# Patient Record
Sex: Female | Born: 1985 | Race: Black or African American | Hispanic: No | Marital: Married | Smoking: Former smoker
Health system: Southern US, Community
[De-identification: ages and names within clinical notes are randomized; demographics above are authoritative.]

## PROBLEM LIST (undated history)

## (undated) ENCOUNTER — Ambulatory Visit: Admission: EM | Payer: Medicaid Other | Source: Home / Self Care

## (undated) ENCOUNTER — Inpatient Hospital Stay: Payer: Self-pay

## (undated) DIAGNOSIS — E669 Obesity, unspecified: Secondary | ICD-10-CM

---
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
## 2011-10-30 ENCOUNTER — Emergency Department: Payer: Self-pay | Admitting: Emergency Medicine

## 2012-01-03 ENCOUNTER — Emergency Department: Payer: Self-pay | Admitting: Emergency Medicine

## 2012-05-19 ENCOUNTER — Emergency Department: Payer: Self-pay | Admitting: Emergency Medicine

## 2012-05-19 LAB — URINALYSIS, COMPLETE
Bilirubin,UR: NEGATIVE
Leukocyte Esterase: NEGATIVE
Nitrite: NEGATIVE
Ph: 6 (ref 4.5–8.0)
Protein: NEGATIVE
RBC,UR: <1 /HPF (ref 0–5)
Specific Gravity: 1.024 (ref 1.003–1.030)

## 2012-12-16 ENCOUNTER — Emergency Department: Payer: Self-pay | Admitting: Emergency Medicine

## 2012-12-24 ENCOUNTER — Emergency Department: Payer: Self-pay | Admitting: Emergency Medicine

## 2012-12-24 LAB — URINALYSIS, COMPLETE
Bacteria: NONE SEEN
Bilirubin,UR: NEGATIVE
Glucose,UR: NEGATIVE mg/dL (ref 0–75)
Ketone: NEGATIVE
Nitrite: NEGATIVE
Ph: 7 (ref 4.5–8.0)
Protein: NEGATIVE
RBC,UR: 1 /HPF (ref 0–5)
Specific Gravity: 1.014 (ref 1.003–1.030)
Squamous Epithelial: 1
WBC UR: <1 /HPF (ref 0–5)

## 2012-12-24 LAB — COMPREHENSIVE METABOLIC PANEL
Anion Gap: 9 (ref 7–16)
BUN: 11 mg/dL (ref 7–18)
Calcium, Total: 8.8 mg/dL (ref 8.5–10.1)
Chloride: 107 mmol/L (ref 98–107)
EGFR (African American): >60
Glucose: 101 mg/dL — ABNORMAL HIGH (ref 65–99)
Sodium: 138 mmol/L (ref 136–145)
Total Protein: 8.3 g/dL — ABNORMAL HIGH (ref 6.4–8.2)

## 2012-12-24 LAB — DRUG SCREEN, URINE
Amphetamines, Ur Screen: NEGATIVE (ref ?–1000)
Benzodiazepine, Ur Scrn: NEGATIVE (ref ?–200)
Cocaine Metabolite,Ur ~~LOC~~: NEGATIVE (ref ?–300)
MDMA (Ecstasy)Ur Screen: NEGATIVE (ref ?–500)
Methadone, Ur Screen: NEGATIVE (ref ?–300)
Phencyclidine (PCP) Ur S: NEGATIVE (ref ?–25)
Tricyclic, Ur Screen: NEGATIVE (ref ?–1000)

## 2012-12-24 LAB — CBC
HCT: 39.6 % (ref 35.0–47.0)
HGB: 13.2 g/dL (ref 12.0–16.0)
MCH: 26.9 pg (ref 26.0–34.0)
MCHC: 33.4 g/dL (ref 32.0–36.0)
MCV: 81 fL (ref 80–100)
Platelet: 253 10*3/uL (ref 150–440)
RBC: 4.91 10*6/uL (ref 3.80–5.20)
WBC: 6.9 10*3/uL (ref 3.6–11.0)

## 2012-12-24 LAB — ETHANOL: Ethanol %: <0.003 % (ref 0.000–0.080)

## 2012-12-24 LAB — PREGNANCY, URINE: Pregnancy Test, Urine: NEGATIVE m[IU]/mL

## 2012-12-24 LAB — TSH: Thyroid Stimulating Horm: 1.08 u[IU]/mL

## 2012-12-24 LAB — TROPONIN I: Troponin-I: <0.02 ng/mL

## 2012-12-24 LAB — CK TOTAL AND CKMB (NOT AT ARMC): CK, Total: 204 U/L (ref 21–215)

## 2013-06-21 ENCOUNTER — Emergency Department: Payer: Self-pay | Admitting: Emergency Medicine

## 2013-07-09 ENCOUNTER — Emergency Department: Payer: Self-pay | Admitting: Emergency Medicine

## 2013-07-09 LAB — CBC WITH DIFFERENTIAL/PLATELET
BASOS ABS: 0.1 10*3/uL (ref 0.0–0.1)
Basophil %: 1.4 %
EOS ABS: 0 10*3/uL (ref 0.0–0.7)
EOS PCT: 0.5 %
HCT: 40.2 % (ref 35.0–47.0)
HGB: 12.2 g/dL (ref 12.0–16.0)
Lymphocyte #: 2.4 10*3/uL (ref 1.0–3.6)
Lymphocyte %: 35.9 %
MCH: 25 pg — ABNORMAL LOW (ref 26.0–34.0)
MCHC: 30.4 g/dL — ABNORMAL LOW (ref 32.0–36.0)
MCV: 82 fL (ref 80–100)
MONOS PCT: 5.7 %
Monocyte #: 0.4 x10 3/mm (ref 0.2–0.9)
NEUTROS ABS: 3.8 10*3/uL (ref 1.4–6.5)
Neutrophil %: 56.5 %
Platelet: 219 10*3/uL (ref 150–440)
RBC: 4.88 10*6/uL (ref 3.80–5.20)
RDW: 15.2 % — ABNORMAL HIGH (ref 11.5–14.5)
WBC: 6.7 10*3/uL (ref 3.6–11.0)

## 2013-07-09 LAB — COMPREHENSIVE METABOLIC PANEL
ALT: 15 U/L (ref 12–78)
Albumin: 3.6 g/dL (ref 3.4–5.0)
Alkaline Phosphatase: 59 U/L
Anion Gap: 3 — ABNORMAL LOW (ref 7–16)
BILIRUBIN TOTAL: 0.3 mg/dL (ref 0.2–1.0)
BUN: 13 mg/dL (ref 7–18)
CHLORIDE: 105 mmol/L (ref 98–107)
CREATININE: 0.7 mg/dL (ref 0.60–1.30)
Calcium, Total: 8.5 mg/dL (ref 8.5–10.1)
Co2: 29 mmol/L (ref 21–32)
EGFR (African American): >60
EGFR (Non-African Amer.): >60
Glucose: 124 mg/dL — ABNORMAL HIGH (ref 65–99)
OSMOLALITY: 275 (ref 275–301)
Potassium: 3.4 mmol/L — ABNORMAL LOW (ref 3.5–5.1)
SGOT(AST): 20 U/L (ref 15–37)
Sodium: 137 mmol/L (ref 136–145)
Total Protein: 7.5 g/dL (ref 6.4–8.2)

## 2013-07-09 LAB — URINALYSIS, COMPLETE
Bacteria: NONE SEEN
Bilirubin,UR: NEGATIVE
Blood: NEGATIVE
GLUCOSE, UR: NEGATIVE mg/dL (ref 0–75)
Nitrite: NEGATIVE
Ph: 5 (ref 4.5–8.0)
Protein: NEGATIVE
RBC,UR: NONE SEEN /HPF (ref 0–5)
Specific Gravity: 1.024 (ref 1.003–1.030)
Squamous Epithelial: 9

## 2013-07-09 LAB — WET PREP, GENITAL

## 2013-07-09 LAB — GC/CHLAMYDIA PROBE AMP

## 2013-07-28 ENCOUNTER — Emergency Department: Payer: Self-pay

## 2013-07-29 ENCOUNTER — Emergency Department: Payer: Self-pay | Admitting: Emergency Medicine

## 2013-07-29 LAB — CBC
HCT: 38.8 % (ref 35.0–47.0)
HGB: 12.6 g/dL (ref 12.0–16.0)
MCH: 26.7 pg (ref 26.0–34.0)
MCHC: 32.4 g/dL (ref 32.0–36.0)
MCV: 82 fL (ref 80–100)
PLATELETS: 264 10*3/uL (ref 150–440)
RBC: 4.72 10*6/uL (ref 3.80–5.20)
RDW: 15.2 % — AB (ref 11.5–14.5)
WBC: 6.9 10*3/uL (ref 3.6–11.0)

## 2013-07-29 LAB — URINALYSIS, COMPLETE
BILIRUBIN, UR: NEGATIVE
Glucose,UR: NEGATIVE mg/dL (ref 0–75)
KETONE: NEGATIVE
Nitrite: NEGATIVE
Ph: 6 (ref 4.5–8.0)
Protein: NEGATIVE
RBC,UR: 1416 /HPF (ref 0–5)
Specific Gravity: 1.023 (ref 1.003–1.030)
Squamous Epithelial: 3

## 2013-07-29 LAB — HCG, QUANTITATIVE, PREGNANCY: BETA HCG, QUANT.: 4 m[IU]/mL — AB

## 2013-08-23 ENCOUNTER — Emergency Department: Payer: Self-pay | Admitting: Emergency Medicine

## 2013-08-23 LAB — URINALYSIS, COMPLETE
BACTERIA: NONE SEEN
BILIRUBIN, UR: NEGATIVE
Blood: NEGATIVE
Glucose,UR: NEGATIVE mg/dL (ref 0–75)
Ketone: NEGATIVE
NITRITE: NEGATIVE
PROTEIN: NEGATIVE
Ph: 7 (ref 4.5–8.0)
SPECIFIC GRAVITY: 1.02 (ref 1.003–1.030)
Squamous Epithelial: 12

## 2013-08-23 LAB — PREGNANCY, URINE: Pregnancy Test, Urine: NEGATIVE m[IU]/mL

## 2013-08-25 LAB — URINE CULTURE

## 2013-09-07 ENCOUNTER — Emergency Department: Payer: Self-pay | Admitting: Emergency Medicine

## 2013-09-17 ENCOUNTER — Emergency Department: Payer: Self-pay | Admitting: Emergency Medicine

## 2013-09-17 LAB — URINALYSIS, COMPLETE
Bilirubin,UR: NEGATIVE
Blood: NEGATIVE
Glucose,UR: NEGATIVE mg/dL (ref 0–75)
Ketone: NEGATIVE
Nitrite: NEGATIVE
PH: 7 (ref 4.5–8.0)
Protein: NEGATIVE
Specific Gravity: 1.021 (ref 1.003–1.030)
Squamous Epithelial: 8
WBC UR: 11 /HPF (ref 0–5)

## 2013-09-17 LAB — GC/CHLAMYDIA PROBE AMP

## 2013-09-17 LAB — WET PREP, GENITAL

## 2013-11-25 IMAGING — CT CT HEAD WITHOUT CONTRAST
1 series · 16 of 30 positions shown, 20 images · non-contrast
Comparison: none

REASON FOR EXAM: SYNCOPE
COMMENTS:

PROCEDURE:     CT  - CT HEAD WITHOUT CONTRAST  - December 24, 2012  [DATE]
RESULT:     Comparison:  None
TECHNIQUE: Multiple axial images from the foramen magnum to the vertex were
obtained without IV contrast.

[Series 2: soft tissue · axial · 0.38mm/px · z∈[+316,+456]mm · 16 of 32 slices shown, 20 images]
[im 2/32  brain]
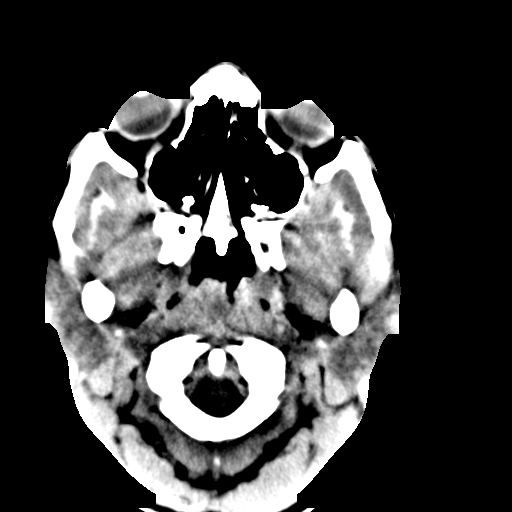
[im 2/32  bone]
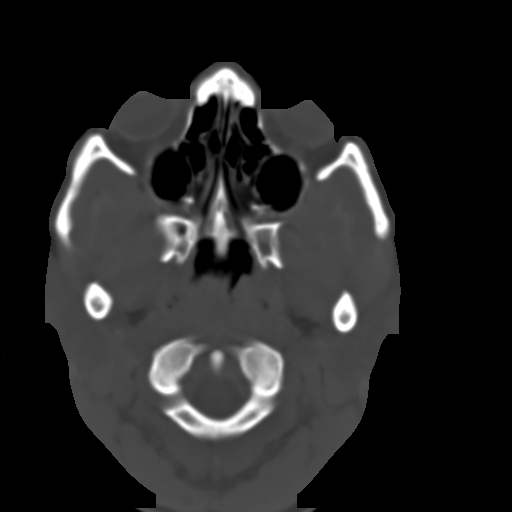
[im 4/32  brain]
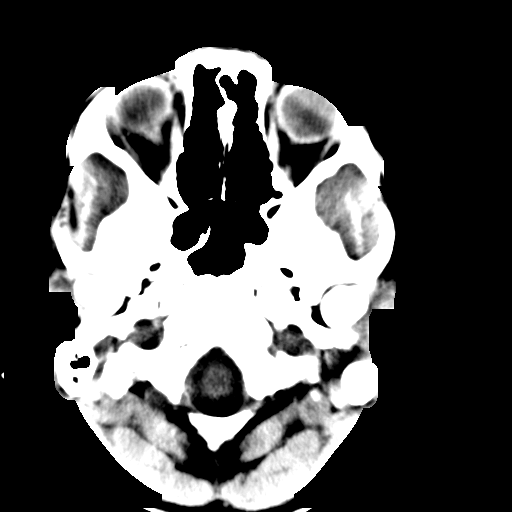
[im 6/32  brain]
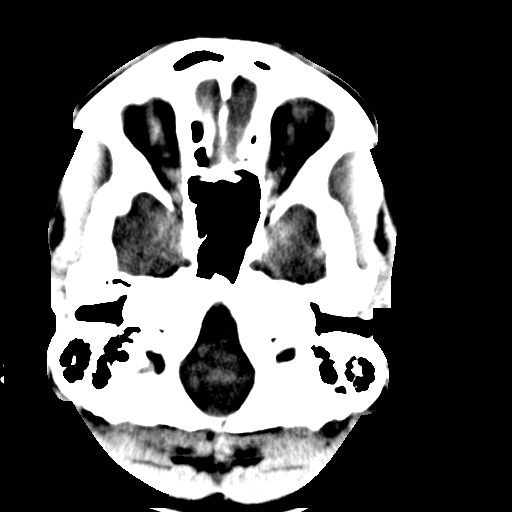
[im 8/32  brain]
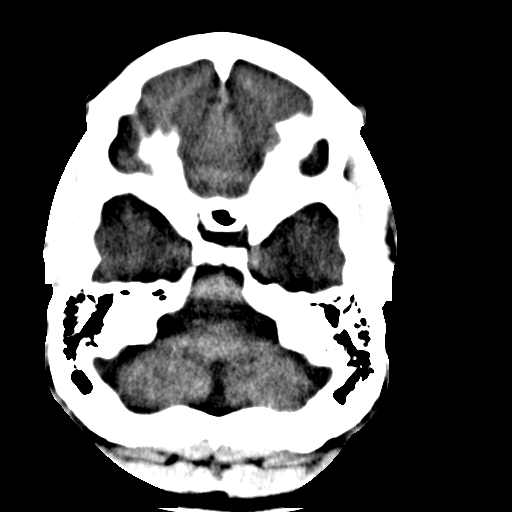
[im 9/32  brain]
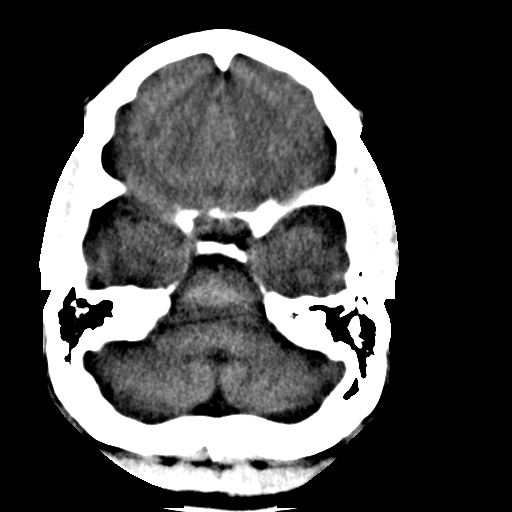
[im 9/32  bone]
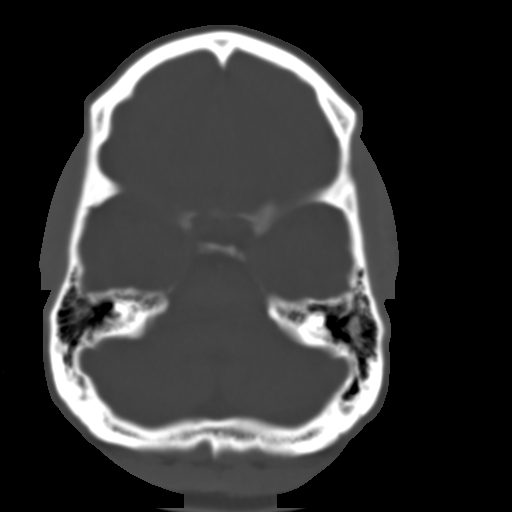
[im 11/32  brain]
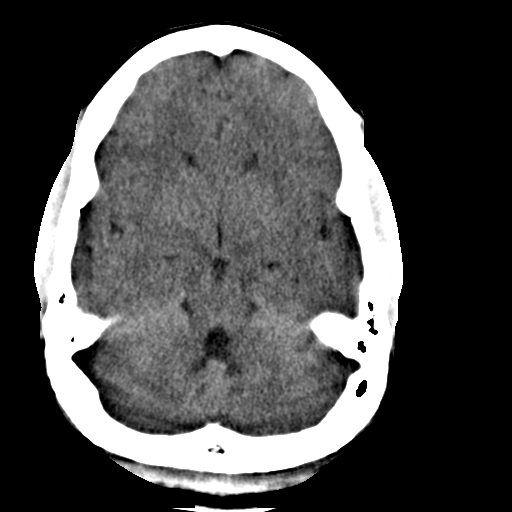
[im 13/32  brain]
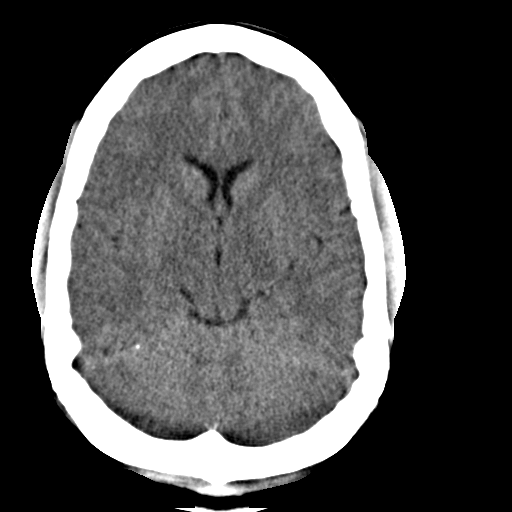
[im 15/32  brain]
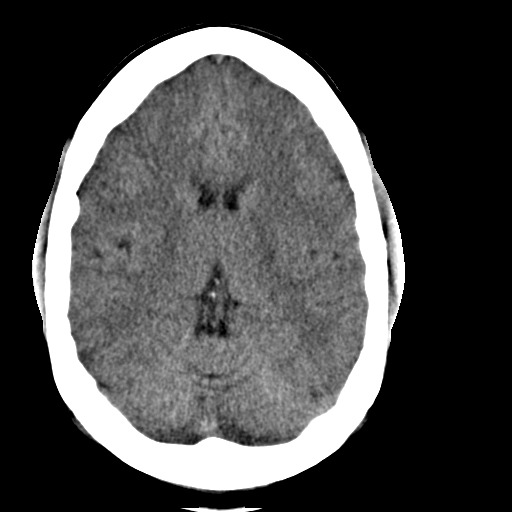
[im 17/32  brain]
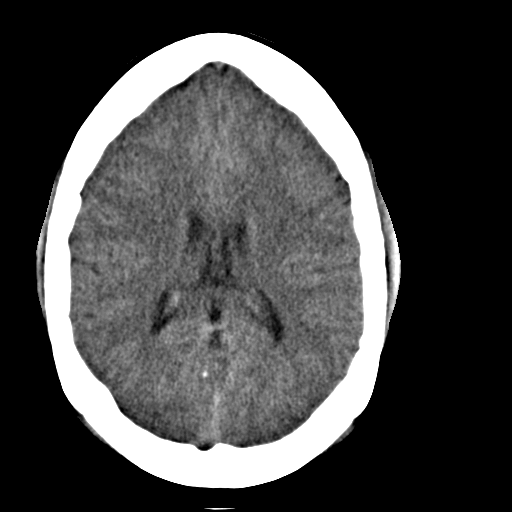
[im 17/32  bone]
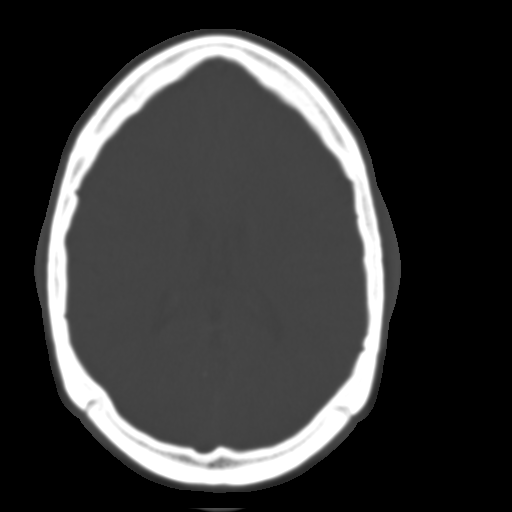
[im 19/32  brain]
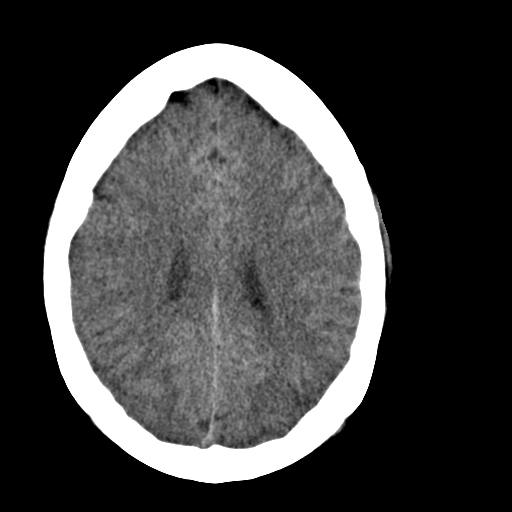
[im 21/32  brain]
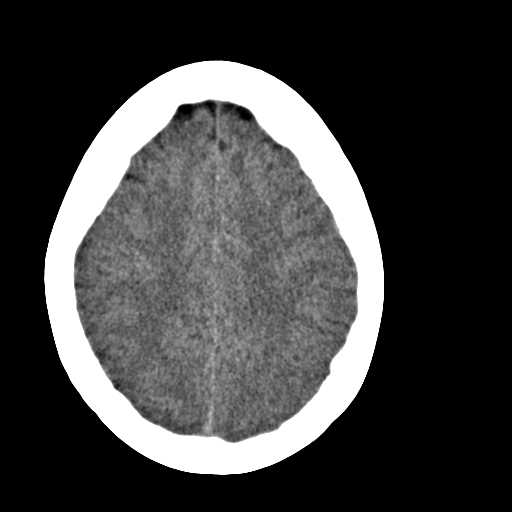
[im 23/32  brain]
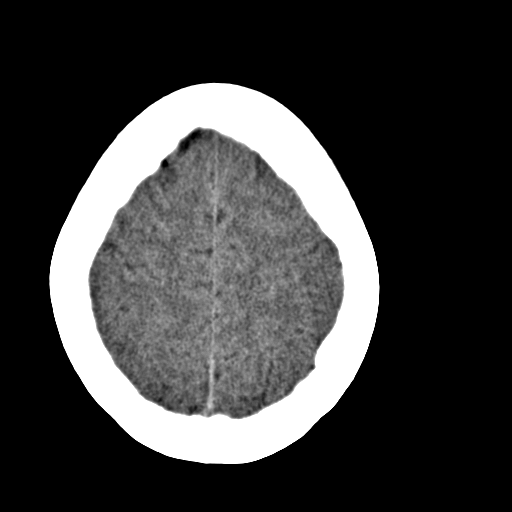
[im 24/32  brain]
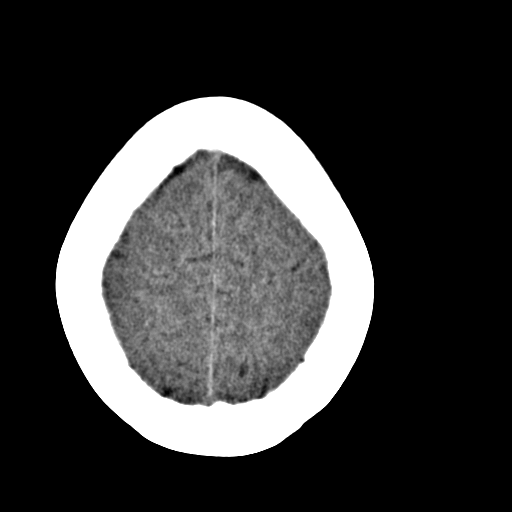
[im 24/32  bone]
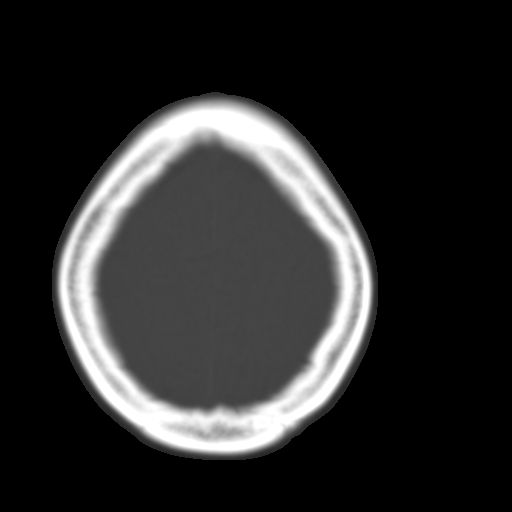
[im 26/32  brain]
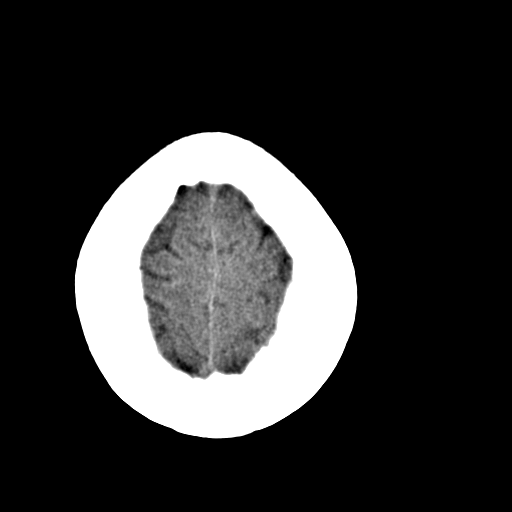
[im 28/32  brain]
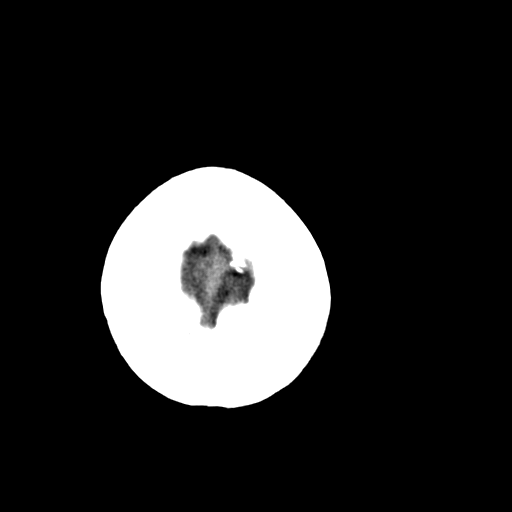
[im 30/32  brain]
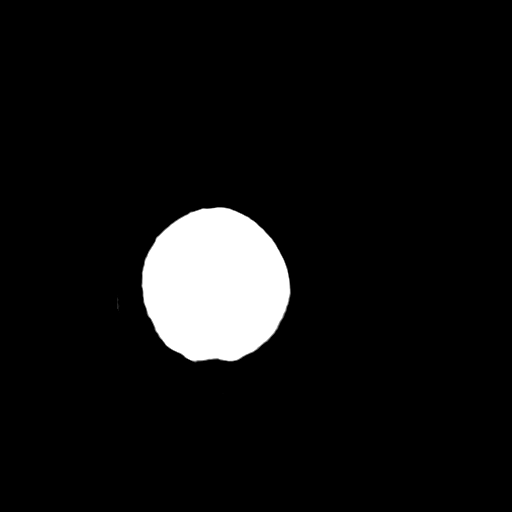

[16 of 30 positions shown; findings below may reference images not displayed]

FINDINGS: There is no evidence of mass effect, midline shift, or extra-axial fluid
collections.  There is no evidence of a space-occupying lesion or
intracranial hemorrhage. There is no evidence of a cortical-based area of
acute infarction.

The ventricles and sulci are appropriate for the patient's age. The basal
cisterns are patent.

Visualized portions of the orbits are unremarkable. The visualized portions
of the paranasal sinuses and mastoid air cells are unremarkable.

The osseous structures are unremarkable.
IMPRESSION: No acute intracranial process.

[REDACTED]

## 2014-03-12 ENCOUNTER — Emergency Department: Payer: Self-pay | Admitting: Emergency Medicine

## 2014-03-12 LAB — BASIC METABOLIC PANEL
Anion Gap: 3 — ABNORMAL LOW (ref 7–16)
BUN: 14 mg/dL (ref 7–18)
CALCIUM: 8.2 mg/dL — AB (ref 8.5–10.1)
CHLORIDE: 105 mmol/L (ref 98–107)
Co2: 30 mmol/L (ref 21–32)
Creatinine: 1 mg/dL (ref 0.60–1.30)
EGFR (African American): >60
Glucose: 92 mg/dL (ref 65–99)
OSMOLALITY: 276 (ref 275–301)
Potassium: 3.7 mmol/L (ref 3.5–5.1)
Sodium: 138 mmol/L (ref 136–145)

## 2014-03-12 LAB — URINALYSIS, COMPLETE
BACTERIA: NONE SEEN
Bilirubin,UR: NEGATIVE
Blood: NEGATIVE
Glucose,UR: NEGATIVE mg/dL (ref 0–75)
Ketone: NEGATIVE
Leukocyte Esterase: NEGATIVE
Nitrite: NEGATIVE
Ph: 6 (ref 4.5–8.0)
Protein: NEGATIVE
RBC,UR: 1 /HPF (ref 0–5)
Specific Gravity: 1.024 (ref 1.003–1.030)
Squamous Epithelial: 4
WBC UR: 1 /HPF (ref 0–5)

## 2014-03-12 LAB — CBC
HCT: 38.6 % (ref 35.0–47.0)
HGB: 12.2 g/dL (ref 12.0–16.0)
MCH: 26 pg (ref 26.0–34.0)
MCHC: 31.7 g/dL — AB (ref 32.0–36.0)
MCV: 82 fL (ref 80–100)
Platelet: 256 10*3/uL (ref 150–440)
RBC: 4.71 10*6/uL (ref 3.80–5.20)
RDW: 14.2 % (ref 11.5–14.5)
WBC: 9.1 10*3/uL (ref 3.6–11.0)

## 2014-03-12 LAB — HCG, QUANTITATIVE, PREGNANCY: Beta Hcg, Quant.: 4090 m[IU]/mL — ABNORMAL HIGH

## 2014-03-25 NOTE — L&D Delivery Note (Signed)
Delivery Note At 17:15 PM a viable female was delivered via Vaginal, Spontaneous Delivery (Presentation: Middle Occiput Anterior).  APGAR: 8, 9; weight 7 lb 13.2 oz (3549 g).   Placenta status: Intact, Spontaneous.  Cord: 3 vessels with the following complications: none observed.  Cord pH: n/a  Anesthesia: None  Episiotomy: None Lacerations: None Suture Repair: none Est. Blood Loss (mL):  300cc  Mom to postpartum.  Baby to Couplet care / Skin to Skin.  Patient was complete and began pushing for a short and controlled second stage.  She delivered as above, with no difficulty.  Baby was placed immediately on mom's chest and was vigorous.  Once pulsing ceased the cord was doubly clamped and cut by dad!  Fundal massage performed to delivery placenta.  No lacerations or abrasions.  We sang happy birthday as baby was recovered by nursing.  Mom was a champ!   ----- Ranae Plumber, MD Attending Obstetrician and Gynecologist [facility]

## 2014-04-01 ENCOUNTER — Emergency Department: Payer: Self-pay | Admitting: Emergency Medicine

## 2014-04-01 LAB — COMPREHENSIVE METABOLIC PANEL
ALBUMIN: 3.1 g/dL — AB (ref 3.4–5.0)
ALK PHOS: 76 U/L
ALT: 40 U/L
AST: 35 U/L (ref 15–37)
Anion Gap: 4 — ABNORMAL LOW (ref 7–16)
BUN: 11 mg/dL (ref 7–18)
Bilirubin,Total: 0.2 mg/dL (ref 0.2–1.0)
CO2: 27 mmol/L (ref 21–32)
Calcium, Total: 9.1 mg/dL (ref 8.5–10.1)
Chloride: 107 mmol/L (ref 98–107)
Creatinine: 0.83 mg/dL (ref 0.60–1.30)
EGFR (Non-African Amer.): >60
GLUCOSE: 98 mg/dL (ref 65–99)
Osmolality: 275 (ref 275–301)
Potassium: 3.9 mmol/L (ref 3.5–5.1)
Sodium: 138 mmol/L (ref 136–145)
Total Protein: 7.3 g/dL (ref 6.4–8.2)

## 2014-04-01 LAB — URINALYSIS, COMPLETE
Bacteria: NONE SEEN
Bilirubin,UR: NEGATIVE
Blood: NEGATIVE
Glucose,UR: NEGATIVE mg/dL (ref 0–75)
Ketone: NEGATIVE
Leukocyte Esterase: NEGATIVE
NITRITE: NEGATIVE
PH: 7 (ref 4.5–8.0)
PROTEIN: NEGATIVE
Specific Gravity: 1.015 (ref 1.003–1.030)
Squamous Epithelial: 1
WBC UR: <1 /HPF (ref 0–5)

## 2014-04-01 LAB — CBC WITH DIFFERENTIAL/PLATELET
Basophil #: 0.1 10*3/uL (ref 0.0–0.1)
Basophil %: 0.9 %
Eosinophil #: 0.1 10*3/uL (ref 0.0–0.7)
Eosinophil %: 1.1 %
HCT: 38.5 % (ref 35.0–47.0)
HGB: 12.2 g/dL (ref 12.0–16.0)
LYMPHS PCT: 27.8 %
Lymphocyte #: 2.8 10*3/uL (ref 1.0–3.6)
MCH: 26 pg (ref 26.0–34.0)
MCHC: 31.7 g/dL — ABNORMAL LOW (ref 32.0–36.0)
MCV: 82 fL (ref 80–100)
MONO ABS: 0.7 x10 3/mm (ref 0.2–0.9)
Monocyte %: 7.3 %
NEUTROS PCT: 62.9 %
Neutrophil #: 6.3 10*3/uL (ref 1.4–6.5)
Platelet: 268 10*3/uL (ref 150–440)
RBC: 4.69 10*6/uL (ref 3.80–5.20)
RDW: 13.9 % (ref 11.5–14.5)
WBC: 10 10*3/uL (ref 3.6–11.0)

## 2014-04-01 LAB — HCG, QUANTITATIVE, PREGNANCY: Beta Hcg, Quant.: 94711 m[IU]/mL — ABNORMAL HIGH

## 2014-04-02 LAB — WET PREP, GENITAL

## 2014-04-02 LAB — GC/CHLAMYDIA PROBE AMP

## 2014-04-22 LAB — OB RESULTS CONSOLE ABO/RH: RH TYPE: POSITIVE

## 2014-06-30 IMAGING — US US OB < 14 WEEKS - US OB TV
1 series · 14 of 28 positions shown · non-contrast
Comparison: None.

CLINICAL DATA: Bleeding and pregnancy.

EXAM:
OBSTETRIC <14 WK US AND TRANSVAGINAL OB US
TECHNIQUE: Both transabdominal and transvaginal ultrasound examinations were
performed for complete evaluation of the gestation as well as the
maternal uterus, adnexal regions, and pelvic cul-de-sac.
Transvaginal technique was performed to assess early pregnancy.

[Series 1: us ob < 14 weeks - us ob tv · 0.20mm/px · 14 of 38 slices shown]
[im 2/38]
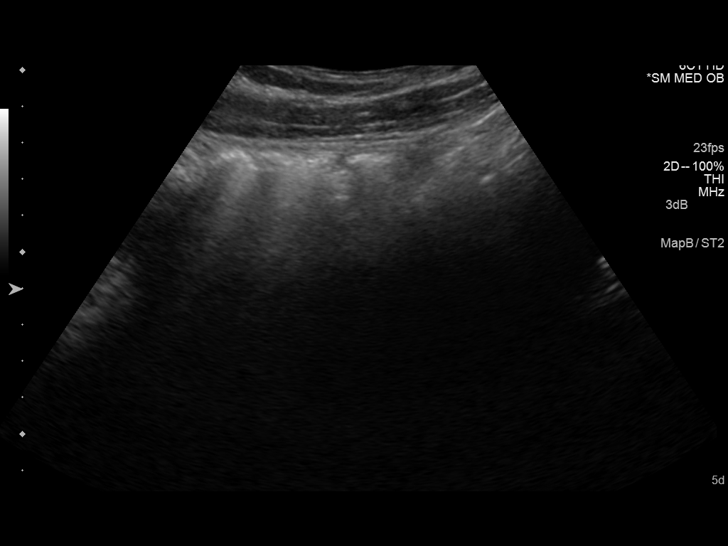
[im 5/38]
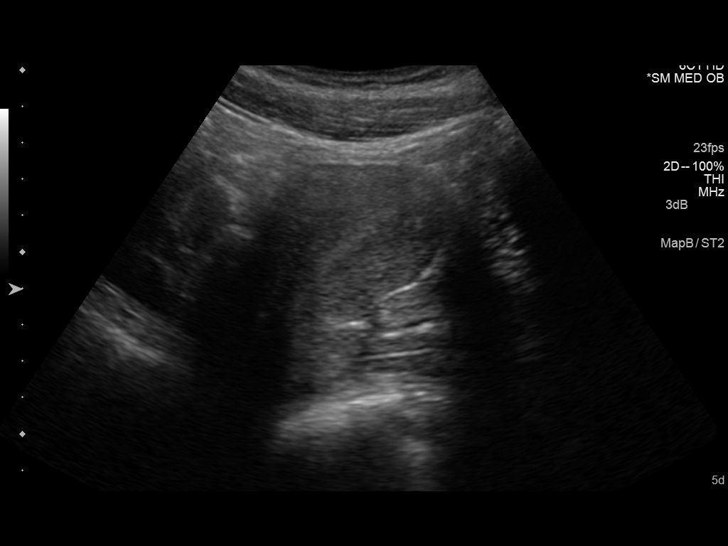
[im 7/38]
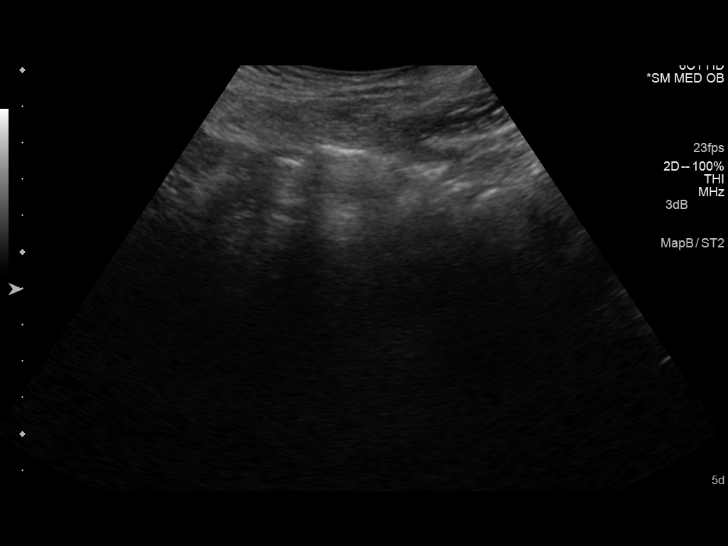
[im 10/38]
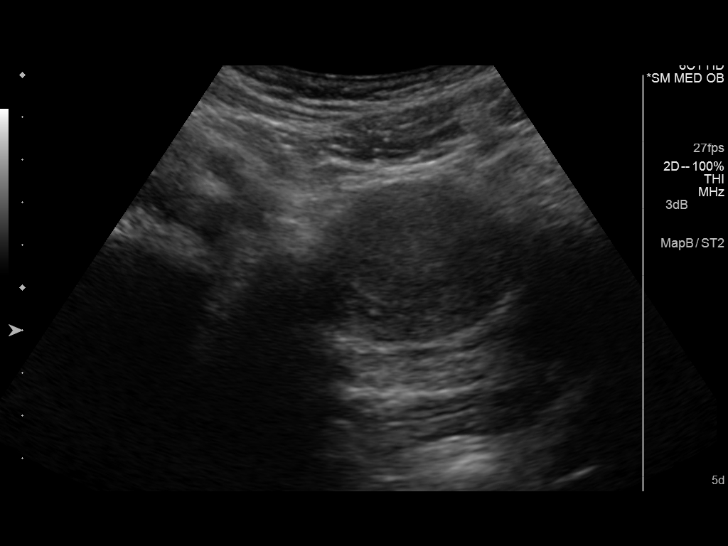
[im 13/38]
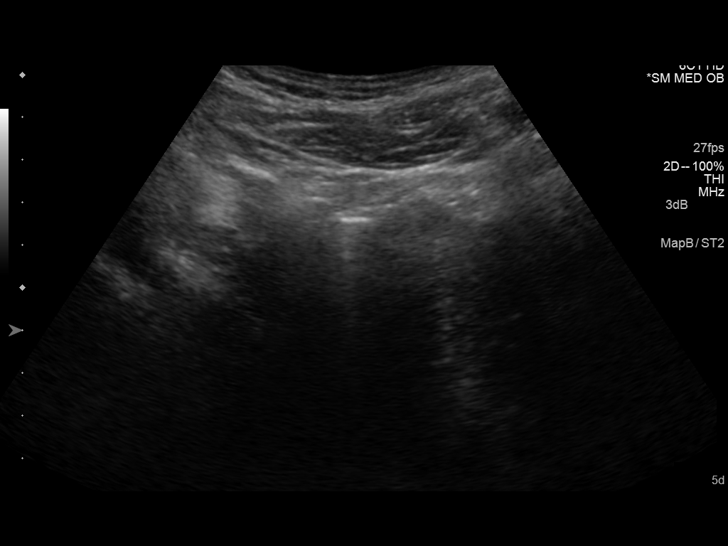
[im 16/38]
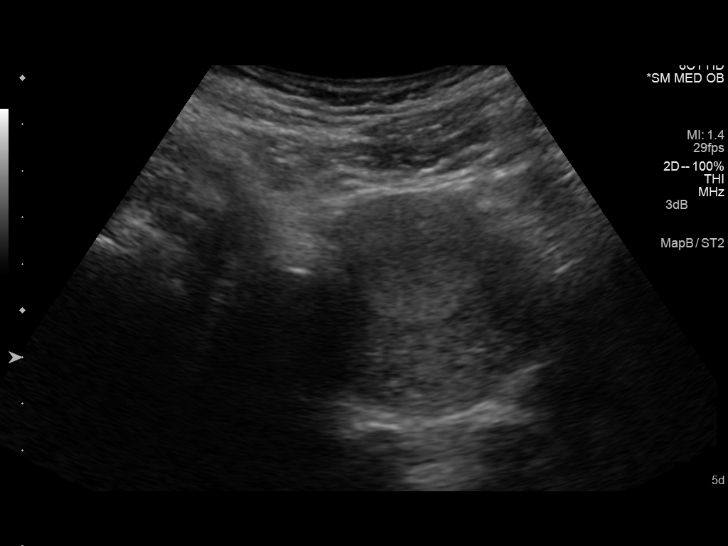
[im 18/38]
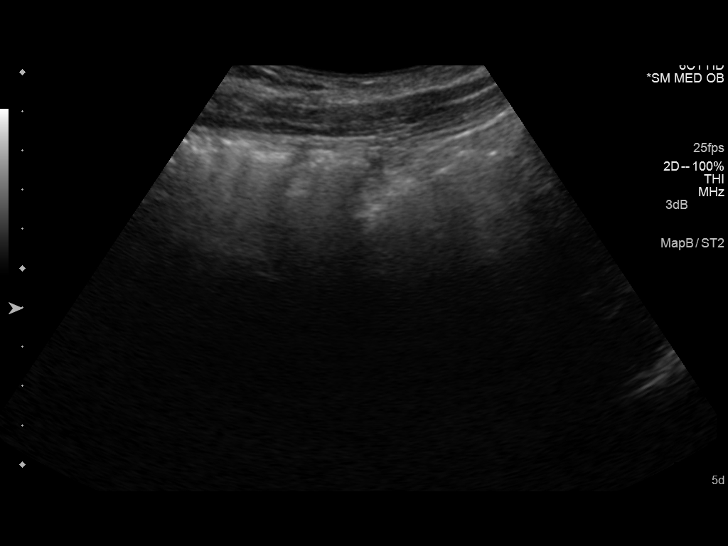
[im 21/38]
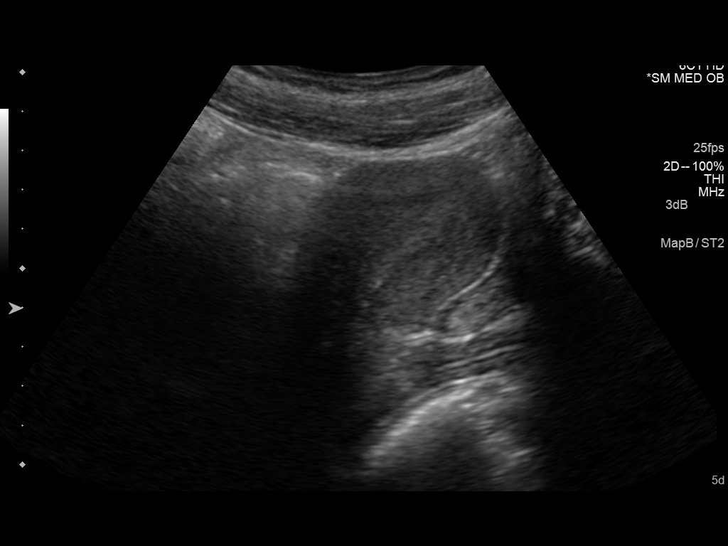
[im 24/38]
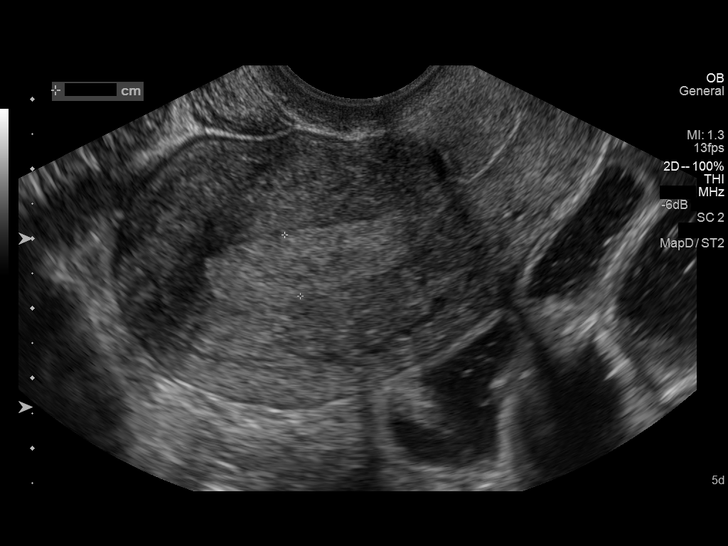
[im 27/38]
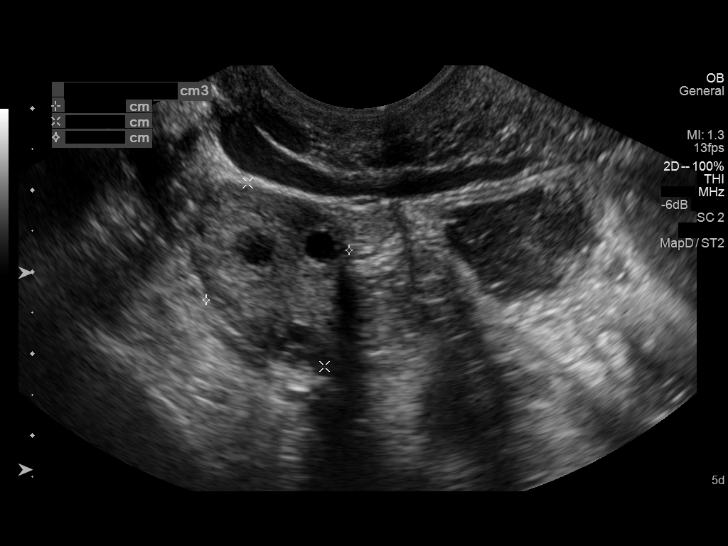
[im 29/38]
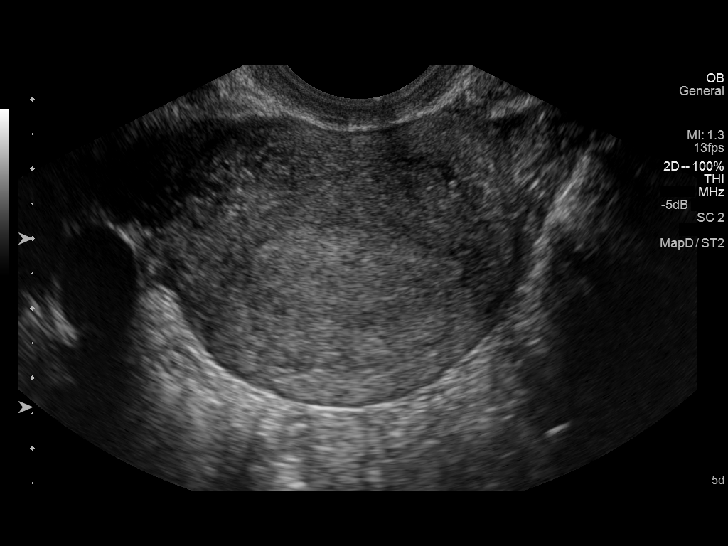
[im 32/38]
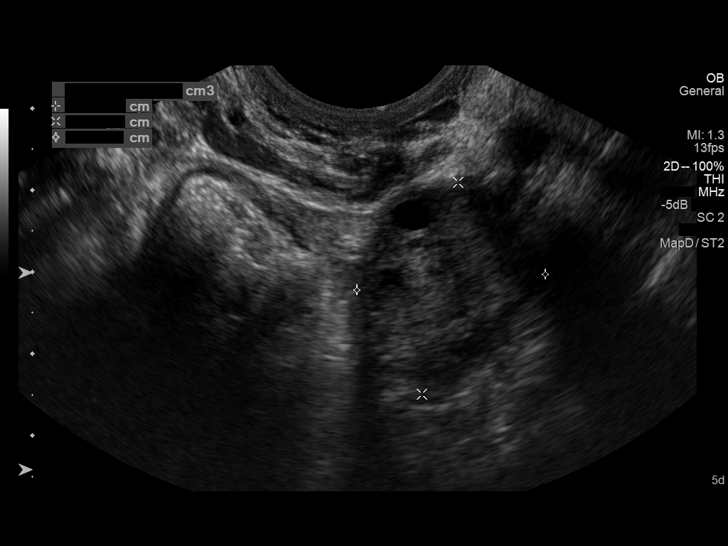
[im 35/38]
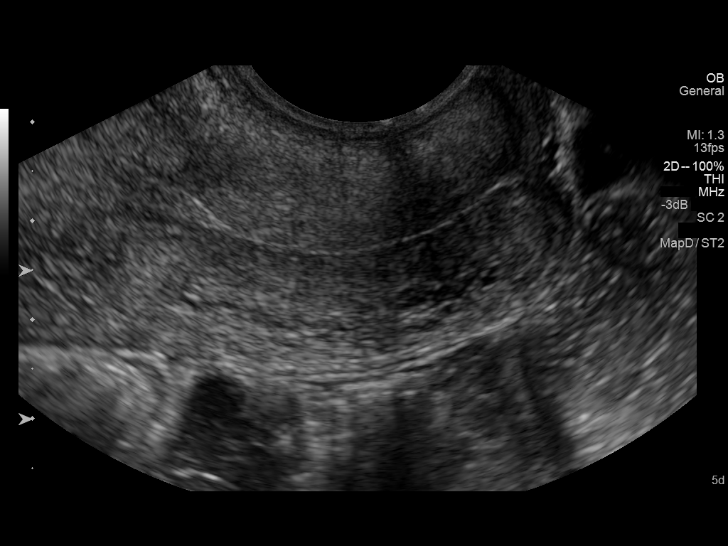
[im 38/38]
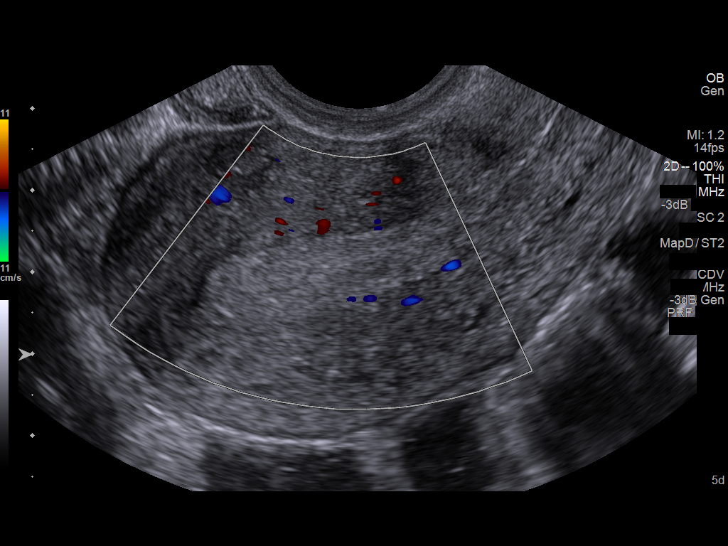

[14 of 28 positions shown; findings below may reference images not displayed]

FINDINGS: No intrauterine gestation identified. The endometrium measures 9 mm.
No adnexal mass. The ovaries are symmetric in size and normal in
appearance. No free pelvic fluid.
IMPRESSION: No visible pregnancy. Differential considerations include
intrauterine gestation too early to be sonographically visualized,
spontaneous abortion, or ectopic pregnancy. Consider follow-up
ultrasound in 10 days and serial quantitative beta HCG follow-up.

## 2014-10-03 ENCOUNTER — Observation Stay
Admission: EM | Admit: 2014-10-03 | Discharge: 2014-10-03 | Disposition: A | Discharge status: Home / Self Care | Payer: Medicaid Other | Attending: Obstetrics and Gynecology | Admitting: Obstetrics and Gynecology

## 2014-10-03 DIAGNOSIS — R102 Pelvic and perineal pain: Secondary | ICD-10-CM | POA: Insufficient documentation

## 2014-10-03 DIAGNOSIS — Z3A35 35 weeks gestation of pregnancy: Secondary | ICD-10-CM | POA: Diagnosis not present

## 2014-10-03 DIAGNOSIS — O26893 Other specified pregnancy related conditions, third trimester: Secondary | ICD-10-CM | POA: Diagnosis not present

## 2014-10-03 DIAGNOSIS — O3421 Maternal care for scar from previous cesarean delivery: Secondary | ICD-10-CM | POA: Diagnosis not present

## 2014-10-03 DIAGNOSIS — Z3490 Encounter for supervision of normal pregnancy, unspecified, unspecified trimester: Secondary | ICD-10-CM

## 2014-10-03 HISTORY — DX: Obesity, unspecified: E66.9

## 2014-10-03 LAB — URINALYSIS COMPLETE WITH MICROSCOPIC (ARMC ONLY)
Bilirubin Urine: NEGATIVE
GLUCOSE, UA: NEGATIVE mg/dL
HGB URINE DIPSTICK: NEGATIVE
Ketones, ur: NEGATIVE mg/dL
NITRITE: NEGATIVE
PH: 7 (ref 5.0–8.0)
PROTEIN: NEGATIVE mg/dL
RBC / HPF: NONE SEEN RBC/hpf (ref 0–5)
Specific Gravity, Urine: 1.01 (ref 1.005–1.030)

## 2014-10-03 MED ORDER — TERBUTALINE SULFATE 1 MG/ML IJ SOLN
0.2500 mg | Freq: Once | INTRAMUSCULAR | Status: AC
  Administered 2014-10-03: 0.25 mg via SUBCUTANEOUS
  Filled 2014-10-03: qty 1

## 2014-10-03 NOTE — Progress Notes (Addendum)
Triage L&D Note  Chief Complaint: Pelvic pain and pressure x 3 days.  HPI: 29 year old G3 P1011 with EDC=11/08/2014 by LMP=02/01/2014 and c/w a 6wk5day ultrasound presents at 34wk6d with lower back pain and groin discomfort/pressure since 8 July. Has been taking 6 ES Tylenol/day with some relief of discomfort. The pelvic pain is constant, but worsens intermittently. Walking seems to help at times and rest seems to help at times. Baby [facility] remarkable for history of Cesarean section 9 years ago for breech presentation. She desires a TOLAC. Prenatal course also remarkable for obesity [facility](BMI31) with a 21# weight gain thus far. One hr GTTs have been WNL. A Circumvallate placenta was noted at the time of her anatomy scan. On a recent 34 week growth scan EFW was 5#4oz (48.2%).   Meds: ES Tylenol, PNV  Allergies: Zyrtec (rash)  Exam Ht 5\' 9"  (1.753 m)  Wt 248 lb (112.492 kg)  BMI 36.61 kg/m2  LMP 02/01/2014  BP 111/62 Pulse 86 General : tired, in NAD Abdomen: tenderness along round ligament area bilaterally Points to lumbar sacral area/hips as to where back pain is FHR: 130s with accelerations to 150s to 160s, moderate variability Contractions: q2-8 min apart, mild to palpation Cervix: FT/70%/-2 to -3/cephalic on ultrasound   Recent Results (from the past 2160 hour(s))  Urinalysis complete, with microscopic ([facility] only)     Status: Abnormal   Collection Time: 10/03/14  3:49 AM  Result Value Ref Range   Color, Urine YELLOW (A) YELLOW   APPearance CLEAR (A) CLEAR   Glucose, UA NEGATIVE NEGATIVE mg/dL   Bilirubin Urine NEGATIVE NEGATIVE   Ketones, ur NEGATIVE NEGATIVE mg/dL   Specific Gravity, Urine 1.010 1.005 - 1.030   Hgb urine dipstick NEGATIVE NEGATIVE   pH 7.0 5.0 - 8.0   Protein, ur NEGATIVE NEGATIVE mg/dL   Nitrite NEGATIVE NEGATIVE   Leukocytes, UA TRACE (A) NEGATIVE   RBC / HPF NONE SEEN 0 - 5 RBC/hpf   WBC, UA 0-5 0 - 5 WBC/hpf   Bacteria, UA RARE (A) NONE  SEEN   Squamous Epithelial / LPF 0-5 (A) NONE SEEN   A: IUP at 34 wk6 days with pelvic pain probably due to pelvic laxity/ round ligaments Preterm contractions R/O PTL Prior Cesarean section  P: PO fluids Terbutaline .25 mgm x1 subcut Recheck cervix after 1-2 hours  Joban Colledge, CNM

## 2014-10-03 NOTE — Progress Notes (Signed)
Triage L&D Note   S: Sleeping intermittently. Did not notice a big change in pelvic pain after terbutaline  O: Irregular mild contractions after terbutaline, most lasting 40 sec or less FHR:130s with accelerations to 150s to 160s, moderate variability Cervix: FT/60%/-3/firm  A: IUP at 35.6 weeks, not in labor Cat 1 FHR tracing Pelvic pain due to pelvic laxity  P: DC home with labor precautions Comfort measures discussed RTO 19 July for ROB or sooner prn  Farrel ConnersGUTIERREZ, Aleck Locklin, CNM

## 2014-10-03 NOTE — OB Triage Note (Signed)
Pt presents to L&D with c/o lower abdominal cramping and back pain

## 2014-10-03 NOTE — OB Triage Note (Signed)
Discharge instructions given to pt. Verbalized understanding. Copy provided. Pt discharged off unit in stable condition ambulatory with significant other.

## 2014-10-12 LAB — OB RESULTS CONSOLE GC/CHLAMYDIA
CHLAMYDIA, DNA PROBE: NEGATIVE
GC PROBE AMP, GENITAL: NEGATIVE

## 2014-10-12 LAB — OB RESULTS CONSOLE GBS: GBS: POSITIVE

## 2014-11-02 ENCOUNTER — Encounter: Payer: Self-pay | Admitting: Anesthesiology

## 2014-11-02 ENCOUNTER — Encounter
Admission: RE | Admit: 2014-11-02 | Discharge: 2014-11-02 | Disposition: A | Discharge status: Home / Self Care | Payer: Medicaid Other | Source: Ambulatory Visit | Attending: Obstetrics & Gynecology | Admitting: Obstetrics & Gynecology

## 2014-11-02 LAB — DIFFERENTIAL
Basophils Absolute: 0.1 10*3/uL (ref 0–0.1)
Basophils Relative: 1 %
EOS PCT: 0 %
Eosinophils Absolute: 0 10*3/uL (ref 0–0.7)
LYMPHS ABS: 1.9 10*3/uL (ref 1.0–3.6)
LYMPHS PCT: 16 %
MONO ABS: 0.5 10*3/uL (ref 0.2–0.9)
MONOS PCT: 4 %
Neutro Abs: 9.4 10*3/uL — ABNORMAL HIGH (ref 1.4–6.5)
Neutrophils Relative %: 79 %

## 2014-11-02 LAB — CBC
HEMATOCRIT: 35.7 % (ref 35.0–47.0)
Hemoglobin: 11.3 g/dL — ABNORMAL LOW (ref 12.0–16.0)
MCH: 25.4 pg — ABNORMAL LOW (ref 26.0–34.0)
MCHC: 31.8 g/dL — ABNORMAL LOW (ref 32.0–36.0)
MCV: 80 fL (ref 80.0–100.0)
Platelets: 246 10*3/uL (ref 150–440)
RBC: 4.46 MIL/uL (ref 3.80–5.20)
RDW: 14.9 % — ABNORMAL HIGH (ref 11.5–14.5)
WBC: 11.9 10*3/uL — AB (ref 3.6–11.0)

## 2014-11-02 LAB — TYPE AND SCREEN
ABO/RH(D): B POS
Antibody Screen: NEGATIVE

## 2014-11-02 LAB — ABO/RH: ABO/RH(D): B POS

## 2014-11-03 ENCOUNTER — Encounter: Payer: Self-pay | Admitting: *Deleted

## 2014-11-03 ENCOUNTER — Encounter
Admission: RE | Disposition: A | Discharge status: Home / Self Care | Payer: Self-pay | Source: Ambulatory Visit | Attending: Obstetrics & Gynecology

## 2014-11-03 ENCOUNTER — Inpatient Hospital Stay
Admission: RE | Admit: 2014-11-03 | Discharge: 2014-11-05 | DRG: 775 | Disposition: A | Discharge status: Home / Self Care | Payer: Medicaid Other | Source: Ambulatory Visit | Attending: Obstetrics & Gynecology | Admitting: Obstetrics & Gynecology

## 2014-11-03 DIAGNOSIS — O3421 Maternal care for scar from previous cesarean delivery: Principal | ICD-10-CM | POA: Diagnosis present

## 2014-11-03 DIAGNOSIS — O99824 Streptococcus B carrier state complicating childbirth: Secondary | ICD-10-CM | POA: Diagnosis present

## 2014-11-03 DIAGNOSIS — Z809 Family history of malignant neoplasm, unspecified: Secondary | ICD-10-CM | POA: Diagnosis not present

## 2014-11-03 DIAGNOSIS — O99330 Smoking (tobacco) complicating pregnancy, unspecified trimester: Secondary | ICD-10-CM | POA: Diagnosis present

## 2014-11-03 DIAGNOSIS — D62 Acute posthemorrhagic anemia: Secondary | ICD-10-CM | POA: Diagnosis present

## 2014-11-03 DIAGNOSIS — Z6831 Body mass index (BMI) 31.0-31.9, adult: Secondary | ICD-10-CM | POA: Diagnosis not present

## 2014-11-03 DIAGNOSIS — O99214 Obesity complicating childbirth: Secondary | ICD-10-CM | POA: Diagnosis present

## 2014-11-03 DIAGNOSIS — Z888 Allergy status to other drugs, medicaments and biological substances status: Secondary | ICD-10-CM | POA: Diagnosis not present

## 2014-11-03 DIAGNOSIS — Z79899 Other long term (current) drug therapy: Secondary | ICD-10-CM | POA: Diagnosis not present

## 2014-11-03 DIAGNOSIS — Z3A39 39 weeks gestation of pregnancy: Secondary | ICD-10-CM | POA: Diagnosis present

## 2014-11-03 DIAGNOSIS — O34219 Maternal care for unspecified type scar from previous cesarean delivery: Secondary | ICD-10-CM | POA: Diagnosis present

## 2014-11-03 LAB — RPR: RPR Ser Ql: NONREACTIVE

## 2014-11-03 LAB — OB RESULTS CONSOLE RUBELLA ANTIBODY, IGM: Rubella: IMMUNE

## 2014-11-03 LAB — HIV ANTIBODY (ROUTINE TESTING W REFLEX): HIV Screen 4th Generation wRfx: NONREACTIVE

## 2014-11-03 LAB — OB RESULTS CONSOLE VARICELLA ZOSTER ANTIBODY, IGG: Varicella: NON-IMMUNE/NOT IMMUNE

## 2014-11-03 SURGERY — Surgical Case
Anesthesia: Choice

## 2014-11-03 MED ORDER — MISOPROSTOL 200 MCG PO TABS
ORAL_TABLET | ORAL | Status: DC
Start: 2014-11-03 — End: 2014-11-03
  Filled 2014-11-03: qty 4

## 2014-11-03 MED ORDER — DIPHENHYDRAMINE HCL 25 MG PO CAPS: 25.0000 mg | ORAL_CAPSULE | Freq: Four times a day (QID) | ORAL | Status: DC | PRN

## 2014-11-03 MED ORDER — IBUPROFEN 600 MG PO TABS
600.0000 mg | ORAL_TABLET | Freq: Four times a day (QID) | ORAL | Status: DC
  Administered 2014-11-03 – 2014-11-05 (×8): 600 mg via ORAL
  Filled 2014-11-03 (×8): qty 1

## 2014-11-03 MED ORDER — DIBUCAINE 1 % RE OINT
1.0000 "application " | TOPICAL_OINTMENT | RECTAL | Status: DC | PRN
  Filled 2014-11-03: qty 28

## 2014-11-03 MED ORDER — LACTATED RINGERS IV SOLN
INTRAVENOUS | Status: DC
  Administered 2014-11-03 (×2): via INTRAVENOUS

## 2014-11-03 MED ORDER — LIDOCAINE HCL (PF) 1 % IJ SOLN
INTRAMUSCULAR | Status: AC
  Filled 2014-11-03: qty 30

## 2014-11-03 MED ORDER — ONDANSETRON HCL 4 MG/2ML IJ SOLN: 4.0000 mg | Freq: Four times a day (QID) | INTRAMUSCULAR | Status: DC | PRN

## 2014-11-03 MED ORDER — DOCUSATE SODIUM 100 MG PO CAPS
100.0000 mg | ORAL_CAPSULE | Freq: Two times a day (BID) | ORAL | Status: DC
  Administered 2014-11-03 – 2014-11-05 (×4): 100 mg via ORAL
  Filled 2014-11-03 (×4): qty 1

## 2014-11-03 MED ORDER — PRENATAL MULTIVITAMIN CH
1.0000 | ORAL_TABLET | Freq: Every day | ORAL | Status: DC
  Administered 2014-11-04 – 2014-11-05 (×2): 1 via ORAL
  Filled 2014-11-03 (×2): qty 1

## 2014-11-03 MED ORDER — SIMETHICONE 80 MG PO CHEW: 80.0000 mg | CHEWABLE_TABLET | ORAL | Status: DC | PRN

## 2014-11-03 MED ORDER — SODIUM CHLORIDE 0.9 % IV SOLN: 1.0000 g | INTRAVENOUS | Status: DC

## 2014-11-03 MED ORDER — FENTANYL CITRATE (PF) 100 MCG/2ML IJ SOLN
INTRAMUSCULAR | Status: AC
  Administered 2014-11-03: 100 ug via INTRAVENOUS
  Filled 2014-11-03: qty 2

## 2014-11-03 MED ORDER — ONDANSETRON HCL 4 MG/2ML IJ SOLN: 4.0000 mg | INTRAMUSCULAR | Status: DC | PRN

## 2014-11-03 MED ORDER — TERBUTALINE SULFATE 1 MG/ML IJ SOLN
0.2500 mg | Freq: Once | INTRAMUSCULAR | Status: DC | PRN
  Filled 2014-11-03: qty 1

## 2014-11-03 MED ORDER — SODIUM CHLORIDE 0.9 % IV SOLN
1.0000 g | INTRAVENOUS | Status: DC
  Administered 2014-11-03: 1 g via INTRAVENOUS
  Filled 2014-11-03 (×6): qty 1000

## 2014-11-03 MED ORDER — TETANUS-DIPHTH-ACELL PERTUSSIS 5-2.5-18.5 LF-MCG/0.5 IM SUSP
0.5000 mL | Freq: Once | INTRAMUSCULAR | Status: AC
  Administered 2014-11-05: 0.5 mL via INTRAMUSCULAR
  Filled 2014-11-03: qty 0.5

## 2014-11-03 MED ORDER — SODIUM CHLORIDE 0.9 % IV SOLN: 2.0000 g | Freq: Once | INTRAVENOUS | Status: DC

## 2014-11-03 MED ORDER — OXYTOCIN 40 UNITS IN LACTATED RINGERS INFUSION - SIMPLE MED
1.0000 m[IU]/min | INTRAVENOUS | Status: DC
  Administered 2014-11-03: 5 m[IU]/min via INTRAVENOUS

## 2014-11-03 MED ORDER — WITCH HAZEL-GLYCERIN EX PADS: 1.0000 "application " | MEDICATED_PAD | CUTANEOUS | Status: DC | PRN

## 2014-11-03 MED ORDER — IBUPROFEN 600 MG PO TABS
ORAL_TABLET | ORAL | Status: AC
  Administered 2014-11-03: 600 mg via ORAL
  Filled 2014-11-03: qty 1

## 2014-11-03 MED ORDER — ACETAMINOPHEN 325 MG PO TABS
650.0000 mg | ORAL_TABLET | ORAL | Status: DC | PRN
  Administered 2014-11-03 – 2014-11-04 (×2): 650 mg via ORAL
  Filled 2014-11-03 (×2): qty 2

## 2014-11-03 MED ORDER — LANOLIN HYDROUS EX OINT: TOPICAL_OINTMENT | CUTANEOUS | Status: DC | PRN

## 2014-11-03 MED ORDER — AMMONIA AROMATIC IN INHA: 0.3000 mL | Freq: Once | RESPIRATORY_TRACT | Status: DC | PRN

## 2014-11-03 MED ORDER — MISOPROSTOL 200 MCG PO TABS
800.0000 ug | ORAL_TABLET | Freq: Once | ORAL | Status: DC | PRN
  Filled 2014-11-03: qty 4

## 2014-11-03 MED ORDER — FENTANYL CITRATE (PF) 100 MCG/2ML IJ SOLN
100.0000 ug | INTRAMUSCULAR | Status: DC | PRN
  Administered 2014-11-03 (×2): 100 ug via INTRAVENOUS
  Filled 2014-11-03: qty 2

## 2014-11-03 MED ORDER — OXYTOCIN 40 UNITS IN LACTATED RINGERS INFUSION - SIMPLE MED
1.0000 m[IU]/min | INTRAVENOUS | Status: DC
  Administered 2014-11-03: 2 m[IU]/min via INTRAVENOUS
  Administered 2014-11-03: 1 m[IU]/min via INTRAVENOUS
  Filled 2014-11-03: qty 1000

## 2014-11-03 MED ORDER — SODIUM CHLORIDE 0.9 % IV SOLN
INTRAVENOUS | Status: AC
  Administered 2014-11-03: 2 g via INTRAVENOUS
  Filled 2014-11-03: qty 2000

## 2014-11-03 MED ORDER — MEDROXYPROGESTERONE ACETATE 150 MG/ML IM SUSP
150.0000 mg | INTRAMUSCULAR | Status: DC | PRN
Start: 2014-11-03 — End: 2014-11-05

## 2014-11-03 MED ORDER — ONDANSETRON HCL 4 MG PO TABS: 4.0000 mg | ORAL_TABLET | ORAL | Status: DC | PRN

## 2014-11-03 MED ORDER — AMMONIA AROMATIC IN INHA
RESPIRATORY_TRACT | Status: AC
  Filled 2014-11-03: qty 10

## 2014-11-03 SURGICAL SUPPLY — 29 items
CANISTER SUCT 3000ML (MISCELLANEOUS) IMPLANT
CATH KIT ON-Q SILVERSOAK 5IN (CATHETERS) IMPLANT
CLOSURE WOUND 1/2 X4 (GAUZE/BANDAGES/DRESSINGS)
DRSG TELFA 3X8 NADH (GAUZE/BANDAGES/DRESSINGS) IMPLANT
ELECT CAUTERY BLADE 6.4 (BLADE) IMPLANT
GAUZE SPONGE 4X4 12PLY STRL (GAUZE/BANDAGES/DRESSINGS) IMPLANT
GLOVE BIOGEL PI IND STRL 6.5 (GLOVE) IMPLANT
GLOVE BIOGEL PI INDICATOR 6.5 (GLOVE)
GLOVE SURG SYN 6.5 ES PF (GLOVE) IMPLANT
GOWN STRL REUS W/ TWL LRG LVL3 (GOWN DISPOSABLE) IMPLANT
GOWN STRL REUS W/TWL LRG LVL3 (GOWN DISPOSABLE)
LIQUID BAND (GAUZE/BANDAGES/DRESSINGS) IMPLANT
NS IRRIG 1000ML POUR BTL (IV SOLUTION) IMPLANT
PACK C SECTION AR (MISCELLANEOUS) IMPLANT
PAD GROUND ADULT SPLIT (MISCELLANEOUS) IMPLANT
PAD OB MATERNITY 4.3X12.25 (PERSONAL CARE ITEMS) IMPLANT
PAD PREP 24X41 OB/GYN DISP (PERSONAL CARE ITEMS) IMPLANT
STRAP SAFETY BODY (MISCELLANEOUS) IMPLANT
STRIP CLOSURE SKIN 1/2X4 (GAUZE/BANDAGES/DRESSINGS) IMPLANT
SUT MNCRL 4-0 (SUTURE)
SUT MNCRL 4-0 27XMFL (SUTURE)
SUT PDS AB 1 TP1 96 (SUTURE) IMPLANT
SUT VIC AB 0 CT1 36 (SUTURE) IMPLANT
SUT VIC AB 2-0 CT1 27 (SUTURE)
SUT VIC AB 2-0 CT1 TAPERPNT 27 (SUTURE) IMPLANT
SUT VIC AB 3-0 SH 27 (SUTURE)
SUT VIC AB 3-0 SH 27X BRD (SUTURE) IMPLANT
SUTURE MNCRL 4-0 27XMF (SUTURE) IMPLANT
SWABSTK COMLB BENZOIN TINCTURE (MISCELLANEOUS) IMPLANT

## 2014-11-03 NOTE — Discharge Instructions (Signed)
Discharge instructions:  ° °Call office if you have any of the following: headache, visual changes, fever >100 F, chills, breast concerns, excessive vaginal bleeding, leg pain or redness, depression or any other concerns.  ° °Activity: Do not lift > 10 lbs for 6 weeks.  °No intercourse or tampons for 6 weeks.  °No driving for 1-2 weeks.  ° °Call your doctor for increased pain or vaginal bleeding, temperature above 100.4, depression, or concerns.  No strenuous activity or heavy lifting for 6 weeks.  No intercourse, tampons, douching, or enemas for 6 weeks.  No tub baths-showers only.  No driving for 2 weeks or while taking pain medications.  Continue prenatal vitamin and iron.  Increase calories and fluids while breastfeeding. ° °

## 2014-11-03 NOTE — H&P (Signed)
OB History & Physical   History of Present Illness:  Chief Complaint:   HPI:  Margaret Ortega is a 29 y.o. G72P1011 female at [redacted]w[redacted]d dated by LMP Ortega/w 1st trimester [place] with EDC of 11/08/14.  Her pregnancy has been complicated by  1. Obesity 2. History of prior cesarean (double layer closure with vicryl) 3. Smoking early in pregnancy, quit in December 2015 upon positive pregnancy test 4. Circumvallate placenta 5. Sciatic pain 6. Varicella non-immune 7. Tdap declined during pregnancy 8. GBS positive  Patient is scheduled for repeat cesarean today.    +FM, no CTX, no LOF, no VB  Maternal Medical History:   Past Medical History  Diagnosis Date  . Obesity     Past Surgical History  Procedure Laterality Date  . Cesarean section      breech presentation    Allergies  Allergen Reactions  . Zyrtec [Cetirizine] Rash    Prior to Admission medications   Medication Sig Start Date End Date Taking? Authorizing Provider  acetaminophen (TYLENOL) 500 MG tablet Take 1,000 mg by mouth every 6 (six) hours as needed.    Historical Provider, MD     Prenatal care site: [facility]  Social History: She  reports that she quit smoking about 8 months ago. She has never used smokeless tobacco. She reports that she does not drink alcohol or use illicit drugs.  Family History: family history includes Cancer in her paternal grandmother and another family member.   Review of Systems: Negative x 10 systems reviewed except as noted in the HPI.     Physical Exam:  Vital Signs: Temp(Src) 97.7 F (36.5 Ortega) (Oral)  Resp 16  LMP 02/01/2014 General: no acute distress.  HEENT: normocephalic, atraumatic Heart: regular rate & rhythm.  No murmurs/rubs/gallops Lungs: clear to auscultation bilaterally, normal respiratory effort Abdomen: soft, gravid, non-tender;  EFW: 7.12 Pelvic:   External: Normal external female genitalia  Cervix: Dilation: 4 / Effacement (%): 80 / Station: -1    Extremities:  non-tender, symmetric, neg edema bilaterally.  DTRs: 2+  Neurologic: Alert & oriented x 3.    Pertinent Results:  Prenatal Labs: Blood type/Rh B+  Antibody screen neg  Rubella Varicella Immune Non-immune  RPR NR  HBsAg neg  HIV neg  GC neg  Chlamydia neg  Genetic screening neg  1 hour GTT 130  3 hour GTT n/a  GBS positive   FHT: 130 mod + accels no decels TOCO: rare SVE: 3.5/80/-1 SSE: n/a  Bedside Ultrasound:  Number of Fetus: 1  Presentation: cephalic  Fluid: adequate  Assessment:  Margaret Ortega is a 29 y.o. G68P1011 female at [redacted]w[redacted]d with IOL at term for attempted TOLAC/VBAC.   Plan:  1. Admit to Labor & Delivery - notify OR staff of change to plan, patient would like IOL. 2. CBC, T&S, Clrs, IVF 3. GBS positive - ampicillin q4h  4. Consents obtained: blood, delivery, permission to treat, and VBAC 5. Pitocin IOL. Will ROM with progress and after adequate treatment for GBS ppx. 6. OB MD (Dr. Elesa Massed and/or Dr. Jean Rosenthal) and Anesthesia will remain in house until delivery.   Margaret Ortega  11/03/2014 7:45 AM

## 2014-11-03 NOTE — Discharge Summary (Signed)
Obstetrical Discharge Summary  Date of Admission: 11/03/2014 Date of Discharge: 11/05/2014  Primary OB: [facility]  Gestational Age at Delivery: [redacted]w[redacted]d   Antepartum complications: circumvallate placenta, varicella non-immune, brief period of smoking, quit 12/15, obesity (BMI 31), history of cesarean 19yrs ago, desired TOLAC, GBS+, varicella non-immune, Tdap declined antepartum. Reason for Admission: induction of labor Date of Delivery:  11/03/14 Delivered By: Leeroy Bock Ward Delivery Type: vaginal birth after cesarean (VBAC) Intrapartum complications/course: Patient was admitted on scheduled day of surgery and found to be 4/80/-1 with a bishop of 11.  She was counseled again of the risks of TOLAC vs repeat surgery and patient elected to attempt vaginal delivery.  She was started on pitocin and AROM'd after adequate treatment for GBS+ with ampicillin.  An IUPC was placed for monitoring purposes only. Patient progressed well and had an uneventful labor and second stage.  No anesthesia. We, of course, sang [name]. Anesthesia: none Placenta: sponatneous Laceration: none Episiotomy: none Newborn Data: Live female Birth Weight: 7 lb 13.2 oz (3549 g) APGAR: 8,    Discharge Physical Exam:  negative  HEMOGLOBIN  Date Value Ref Range Status  11/04/2014 11.0* 12.0 - 16.0 g/dL Final   HGB  Date Value Ref Range Status  04/01/2014 12.2 12.0-16.0 g/dL Final   HCT  Date Value Ref Range Status  11/04/2014 34.5* 35.0 - 47.0 % Final  04/01/2014 38.5 35.0-47.0 % Final    Post partum course: uncomplicated Postpartum Procedures: none Disposition: stable, discharge to home.  Rh Immune globulin given: no Rubella vaccine given: no Tdap vaccine given in AP or PP setting: post partum Flu vaccine given in AP or PP setting: no  Contraception: abstinence  Prenatal Labs:  Blood type/Rh B+  Antibody screen neg  Rubella Varicella Immune Non-immune  RPR NR  HBsAg neg  HIV neg   GC neg  Chlamydia neg  Genetic screening neg  1 hour GTT 130  3 hour GTT n/a  GBS positive          Plan:  Margaret Ortega was discharged to home in good condition. Follow-up appointment at [facility] with Dr Elesa Massed in 6 weeks for a routine post partum check.   Discharge Medications:   Medication List    ASK your doctor about these medications        acetaminophen 500 MG tablet  Commonly known as:  TYLENOL  Take 1,000 mg by mouth every 6 (six) hours as needed.        Signed: Cornelia Copa MD [facility]  Pager: [phone]

## 2014-11-04 LAB — CBC
HCT: 34.5 % — ABNORMAL LOW (ref 35.0–47.0)
Hemoglobin: 11 g/dL — ABNORMAL LOW (ref 12.0–16.0)
MCH: 25.8 pg — ABNORMAL LOW (ref 26.0–34.0)
MCHC: 31.9 g/dL — AB (ref 32.0–36.0)
MCV: 80.8 fL (ref 80.0–100.0)
PLATELETS: 222 10*3/uL (ref 150–440)
RBC: 4.27 MIL/uL (ref 3.80–5.20)
RDW: 14.9 % — ABNORMAL HIGH (ref 11.5–14.5)
WBC: 16.9 10*3/uL — AB (ref 3.6–11.0)

## 2014-11-04 MED ORDER — BENZOCAINE-MENTHOL 20-0.5 % EX AERO
1.0000 "application " | INHALATION_SPRAY | Freq: Four times a day (QID) | CUTANEOUS | Status: DC | PRN
  Administered 2014-11-04 – 2014-11-05 (×2): 1 via TOPICAL
  Filled 2014-11-04 (×2): qty 56

## 2014-11-04 MED ORDER — HYDROCODONE-ACETAMINOPHEN 5-325 MG PO TABS
1.0000 | ORAL_TABLET | ORAL | Status: DC | PRN
Start: 2014-11-04 — End: 2014-11-05
  Administered 2014-11-04: 1 via ORAL
  Administered 2014-11-05: 2 via ORAL
  Filled 2014-11-04: qty 1
  Filled 2014-11-04: qty 2

## 2014-11-04 NOTE — Progress Notes (Signed)
  Subjective:  Doing well.  Minimal lochia.  Still having significant cramping not relieved by tylenol and motrin.     Objective:  Blood pressure 112/63, pulse 70, temperature 98.5 F (36.9 C), temperature source Oral, resp. rate 18, last menstrual period 02/01/2014, unknown if currently breastfeeding.  General: NAD Pulmonary: no increased work of breathing Abdomen: non-distended, non-tender, fundus firm at level of umbilicus Extremities: no edema, no erythema, no tenderness  Results for orders placed or performed during the hospital encounter of 11/03/14 (from the past 72 hour(s))  OB RESULTS CONSOLE Rubella Antibody     Status: None   Collection Time: 11/03/14  9:40 AM  Result Value Ref Range   Rubella Immune   OB RESULTS CONSOLE Varicella zoster antibody, IgG     Status: None   Collection Time: 11/03/14  9:40 AM  Result Value Ref Range   Varicella Nonimmune   CBC     Status: Abnormal   Collection Time: 11/04/14  6:20 AM  Result Value Ref Range   WBC 16.9 (H) 3.6 - 11.0 K/uL   RBC 4.27 3.80 - 5.20 MIL/uL   Hemoglobin 11.0 (L) 12.0 - 16.0 g/dL   HCT 16.1 (L) 09.6 - 04.5 %   MCV 80.8 80.0 - 100.0 fL   MCH 25.8 (L) 26.0 - 34.0 pg   MCHC 31.9 (L) 32.0 - 36.0 g/dL   RDW 40.9 (H) 81.1 - 91.4 %   Platelets 222 150 - 440 K/uL    Assessment:   29 y.o. [id number] postpartum day #1 VBAC  Plan:   1) Acute blood loss anemia - hemodynamically stable and asymptomatic - po ferrous sulfate  2) B pos / RI / VZNI - varicella vaccination postpartum  3) TDAP status - verify received antepartum prior to discharge or vaccinate postpartum  4) Breast/mini pill  5) Disposition - anticipate discharge PPD#2

## 2014-11-05 MED ORDER — DOCUSATE SODIUM 100 MG PO CAPS: 100.0000 mg | ORAL_CAPSULE | Freq: Two times a day (BID) | ORAL | Status: DC | PRN

## 2014-11-05 MED ORDER — IBUPROFEN 600 MG PO TABS: 600.0000 mg | ORAL_TABLET | Freq: Four times a day (QID) | ORAL | Status: DC | PRN

## 2014-11-05 MED ORDER — PRENATAL MULTIVITAMIN CH: 1.0000 | ORAL_TABLET | Freq: Every day | ORAL | Status: DC

## 2014-11-05 NOTE — Progress Notes (Signed)
Dr. Vergie Living contacted regarding prenatal records stating that pt is non-immune to [place].  MD states he does not want to order Varicella vaccine at this time. Reynold Bowen, RN 11/05/2014 2:40 PM

## 2014-11-05 NOTE — Progress Notes (Addendum)
Daily Post Partum Note  Margaret Ortega is a 29 y.o. [id number] PPD#2 s/p  VBAC/IP  @ [redacted]w[redacted]d  Pregnancy c/b h/o prior c-section  24hr/overnight events:  none  Subjective:  Doing well. Meeting all PP goals  Objective:    Current Vital Signs 24h Vital Sign Ranges  T 98.3 F (36.8 C) Temp  Avg: 98.3 F (36.8 C)  Min: 98.1 F (36.7 C)  Max: 98.6 F (37 C)  BP 117/68 mmHg BP  Min: 97/67  Max: 117/68  HR 63 Pulse  Avg: 76  Min: 63  Max: 81  RR 20 Resp  Avg: 18.8  Min: 18  Max: 20  SaO2 100 %   SpO2  Avg: 100 %  Min: 100 %  Max: 100 %       24 Hour I/O Current Shift I/O  Time Ins Outs 08/12 0701 - 08/13 0700 In: 240 [P.O.:240] Out: -       General: NAD Abdomen: FF below the umbilicus, soft, nttp Perineum: deferred Skin:  Warm and dry.  Cardiovascular:Regular rate and rhythm. Respiratory:  Clear to auscultation bilateral. Normal respiratory effort Extremities: no c/c/e  Medications Current Facility-Administered Medications  Medication Dose Route Frequency Provider Last Rate Last Dose  . acetaminophen (TYLENOL) tablet 650 mg  650 mg Oral Q4H PRN Elenora Fender Ward, MD   650 mg at 11/04/14 0503  . ammonia inhalant 0.3 mL  0.3 mL Inhalation Once PRN Farrel Conners, CNM      . ampicillin (OMNIPEN) 2 g in sodium chloride 0.9 % 50 mL IVPB  2 g Intravenous Once Farrel Conners, CNM      . benzocaine-Menthol (DERMOPLAST) 20-0.5 % topical spray 1 application  1 application Topical QID PRN Marta Antu, CNM   1 application at 11/04/14 1623  . witch hazel-glycerin (TUCKS) pad 1 application  1 application Topical PRN Chelsea C Ward, MD       And  . dibucaine (NUPERCAINAL) 1 % rectal ointment 1 application  1 application Rectal PRN Chelsea C Ward, MD      . diphenhydrAMINE (BENADRYL) capsule 25 mg  25 mg Oral Q6H PRN Chelsea C Ward, MD      . docusate sodium (COLACE) capsule 100 mg  100 mg Oral BID Elenora Fender Ward, MD   100 mg at 11/04/14 2159  . HYDROcodone-acetaminophen (NORCO/VICODIN)  5-325 MG per tablet 1-2 tablet  1-2 tablet Oral Q4H PRN Vena Austria, MD   2 tablet at 11/05/14 0008  . ibuprofen (ADVIL,MOTRIN) tablet 600 mg  600 mg Oral 4 times per day Leola Brazil, MD   600 mg at 11/05/14 0653  . lactated ringers infusion   Intravenous Continuous Elenora Fender Ward, MD 125 mL/hr at 11/03/14 1353    . lanolin ointment   Topical PRN Chelsea C Ward, MD      . medroxyPROGESTERone (DEPO-PROVERA) injection 150 mg  150 mg Intramuscular Prior to discharge Chelsea C Ward, MD      . ondansetron [facility]) injection 4 mg  4 mg Intravenous Q6H PRN Farrel Conners, CNM      . ondansetron (ZOFRAN) tablet 4 mg  4 mg Oral Q4H PRN Chelsea C Ward, MD       Or  . ondansetron (ZOFRAN) injection 4 mg  4 mg Intravenous Q4H PRN Chelsea C Ward, MD      . prenatal multivitamin tablet 1 tablet  1 tablet Oral Q1200 Elenora Fender Ward, MD   1 tablet at 11/04/14 0915  .  simethicone (MYLICON) chewable tablet 80 mg  80 mg Oral PRN Chelsea C Ward, MD      . Tdap (BOOSTRIX) injection 0.5 mL  0.5 mL Intramuscular Once Elenora Fender Ward, MD         Recent Labs Lab 11/02/14 1048 11/04/14 0620  WBC 11.9* 16.9*  HGB 11.3* 11.0*  HCT 35.7 34.5*  PLT 246 222    Assessment & Plan:  Pt doing well *Postpartum/postop: routine care * home today  B POS / Rubella Immune / Varicella NI (needs vaccine PP) RPR negative / HIV negative / HepBsAg negative / Tdap ordered PP/pap neg 2015 / Breast  / abstinence / Follow up: Johnsie Kindred MD [facility] Pager [phone]

## 2014-11-05 NOTE — Progress Notes (Signed)
Pt discharged at this time via wheelchair escorted by RN; pt going home with significant other and new baby

## 2014-11-09 ENCOUNTER — Other Ambulatory Visit: Payer: Self-pay

## 2014-12-02 ENCOUNTER — Encounter: Payer: Self-pay | Admitting: Obstetrics & Gynecology

## 2015-02-11 IMAGING — US US OB < 14 WEEKS - US OB TV
1 series · 13 of 28 positions shown · non-contrast
Comparison: None.

CLINICAL DATA: Left-sided pelvic pain radiating to back and legs
for 3 days. Estimated gestational age by last menstrual period
equals 5 weeks 2 days

EXAM:
OBSTETRIC <14 WK US AND TRANSVAGINAL OB US
TECHNIQUE: Both transabdominal and transvaginal ultrasound examinations were
performed for complete evaluation of the gestation as well as the
maternal uterus, adnexal regions, and pelvic cul-de-sac.
Transvaginal technique was performed to assess early pregnancy.

[Series 1: us ob < 14 weeks - us ob tv · 0.24mm/px · 13 of 85 slices shown]
[im 4/85]
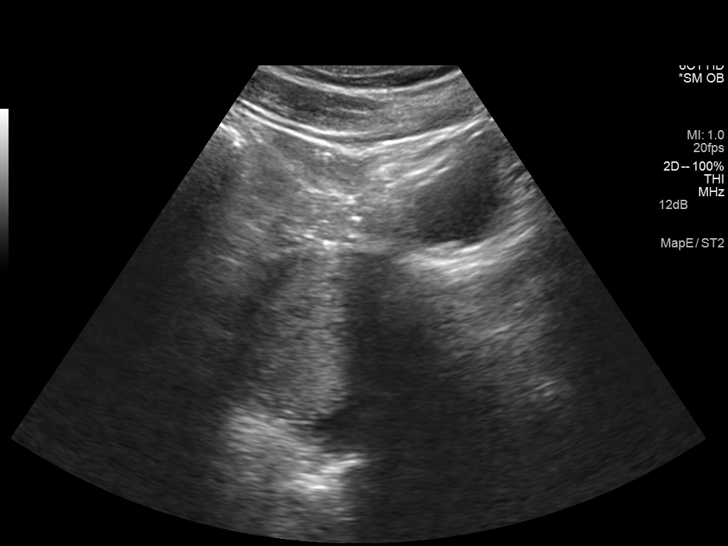
[im 10/85]
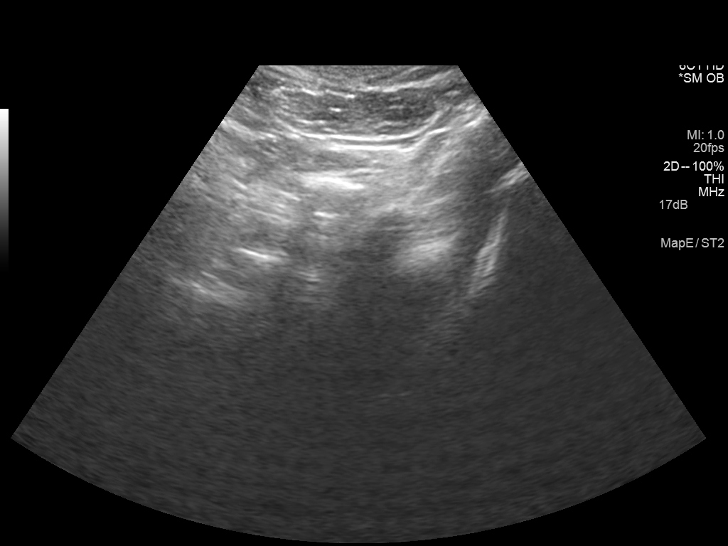
[im 16/85]
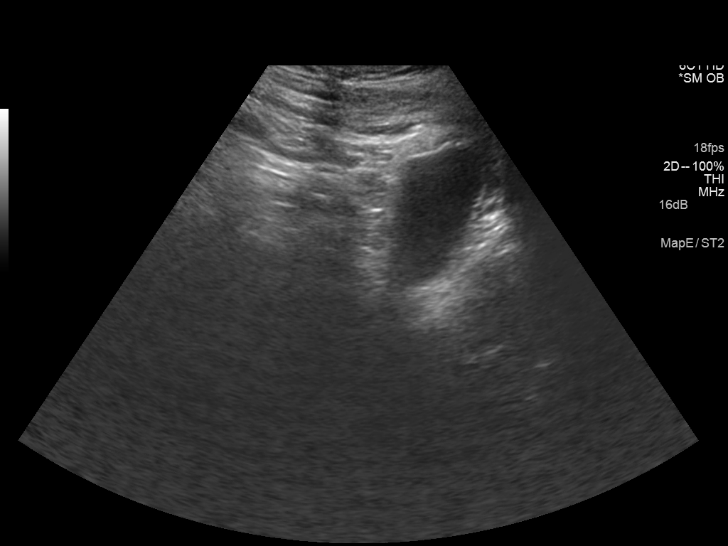
[im 22/85]
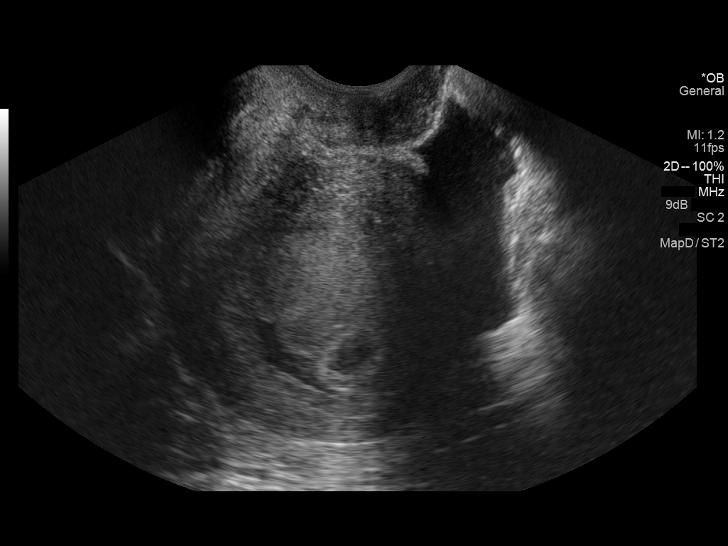
[im 29/85]
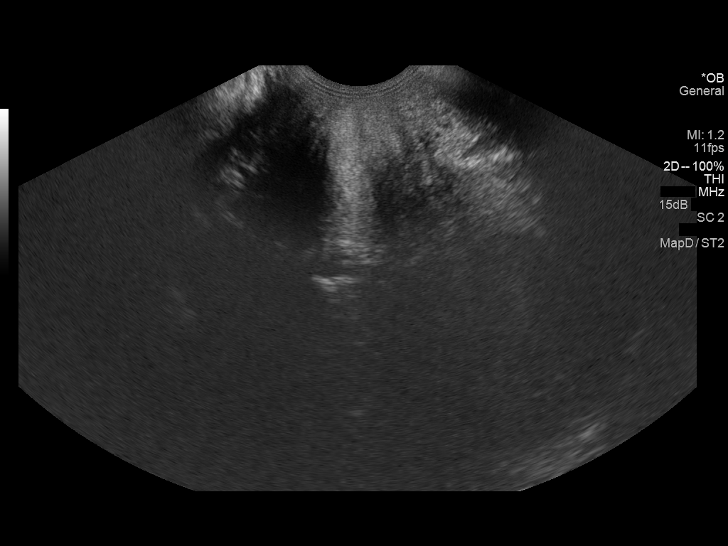
[im 35/85]
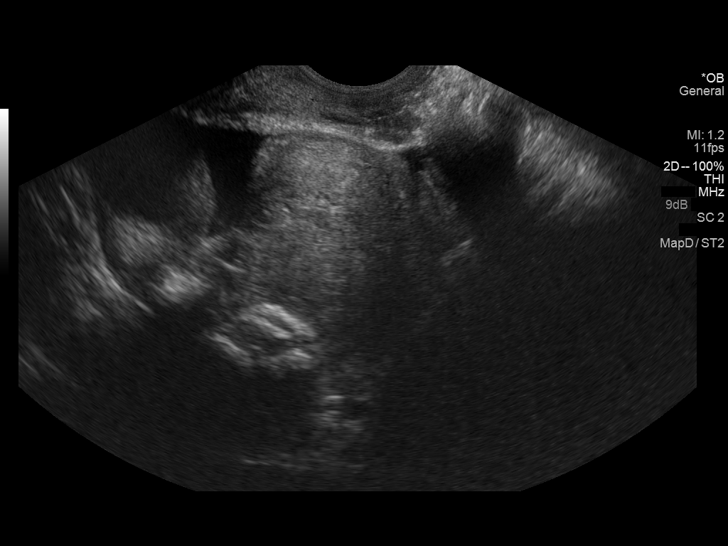
[im 44/85]
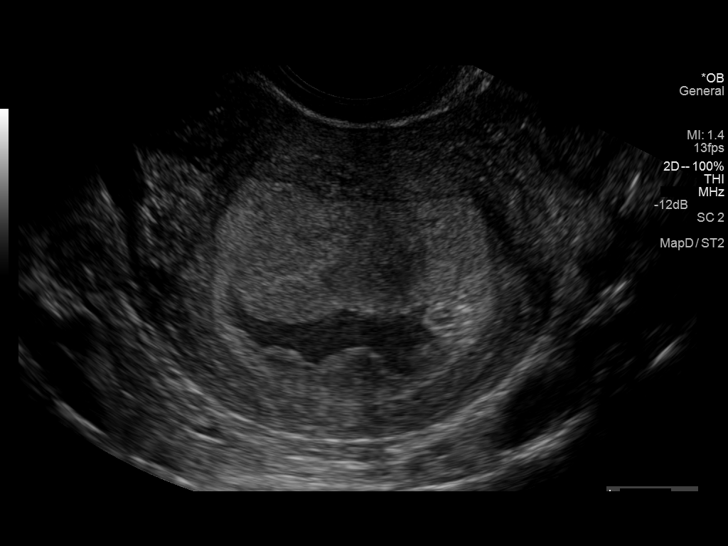
[im 50/85]
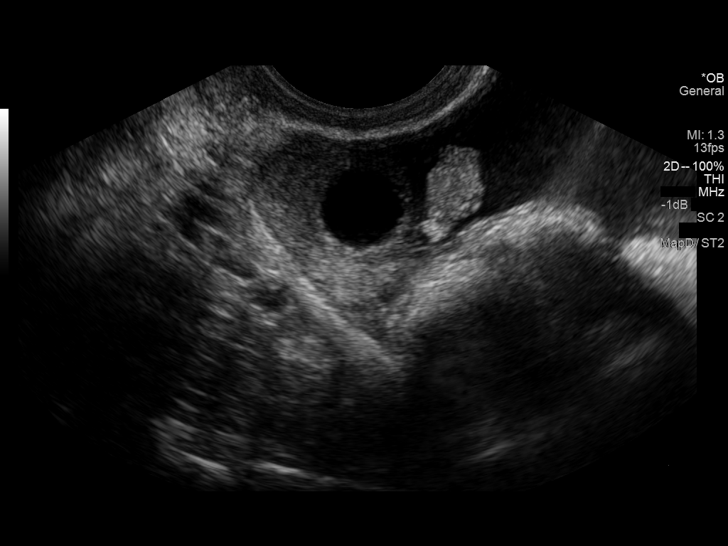
[im 57/85]
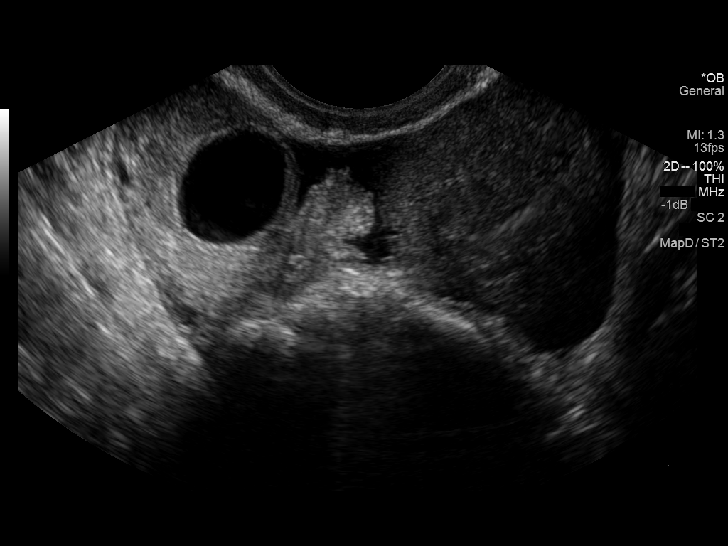
[im 63/85]
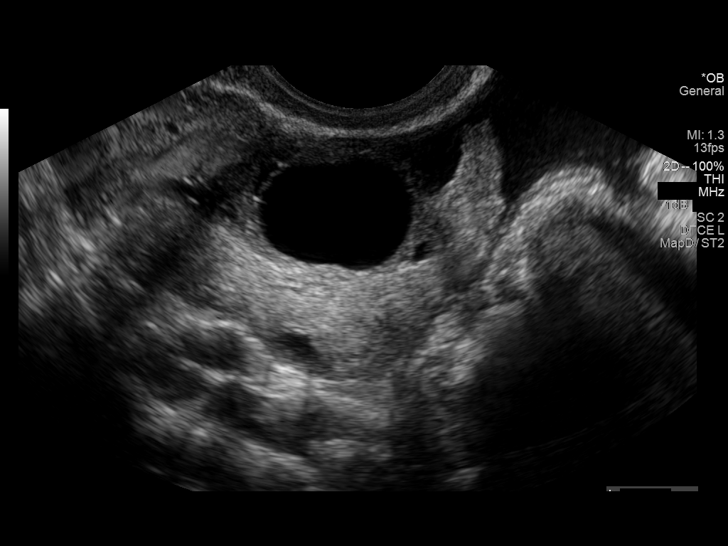
[im 69/85]
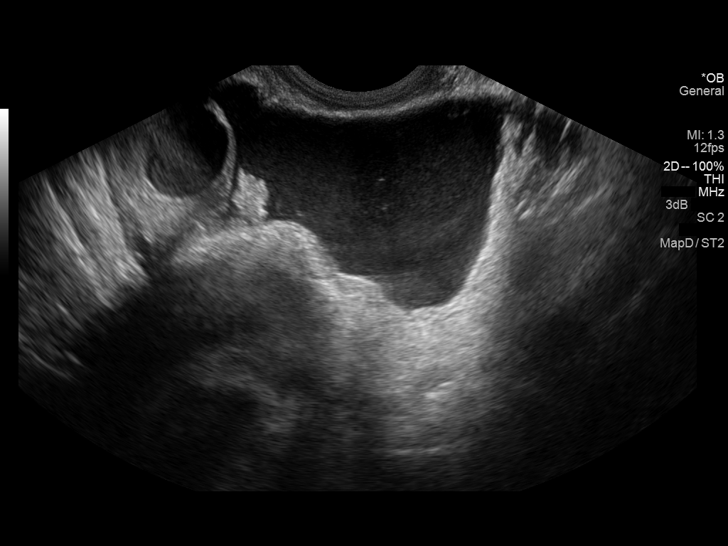
[im 75/85]
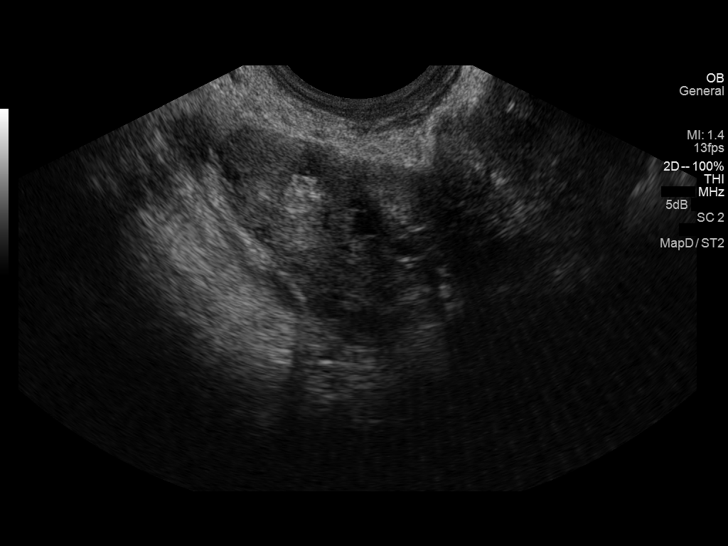
[im 81/85]
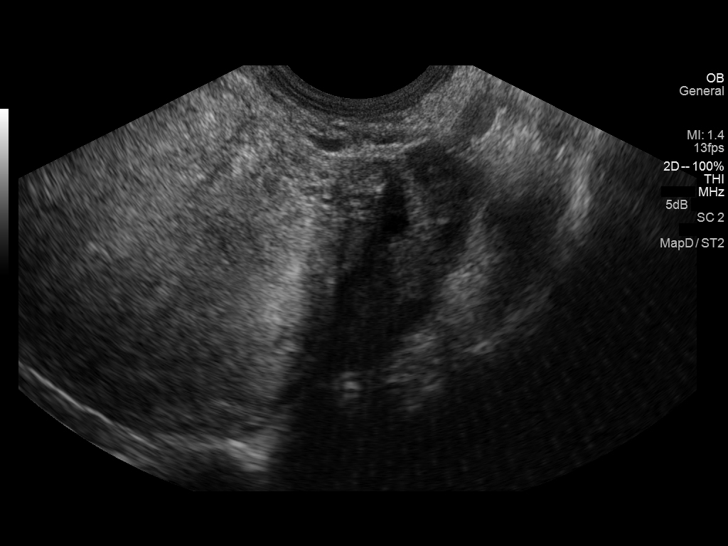

[13 of 28 positions shown; findings below may reference images not displayed]

FINDINGS: Intrauterine gestational sac: Present

Yolk sac:  Present

Embryo:  Not identified

Cardiac Activity: Not identified

MSD:  8  mm   5 w   4  d

Maternal uterus/adnexae: Ovaries are normal. Dominant follicle the
right ovary.

There is a angular fluid collection adjacent to the gestational sac
measuring 1.0 x 0.6 x 2.9 cm consistent with a moderate size
subchorionic hemorrhage.

There is a moderate volume of mildly complex free fluid the
posterior cul-de-sac suggesting a small amount of hemorrhage.
IMPRESSION: 1. Single intrauterine gestation with yolk sac. Estimated
gestational age 5 weeks 4 days.
2. Moderate subchorionic hemorrhage.
3. Moderate volume of free fluid which is mildly complex suggesting
component of hemorrhage.
4. Ovaries are normal.

## 2015-03-03 IMAGING — US US OB < 14 WEEKS - US OB TV
1 series · 14 of 28 positions shown · non-contrast
Comparison: Pelvic ultrasound 03/12/2014

CLINICAL DATA: Right lower quadrant pain. Estimated gestational age
by last menstrual period equals 8 weeks 3 days.

EXAM:
OBSTETRIC <14 WK US AND TRANSVAGINAL OB US
TECHNIQUE: Both transabdominal and transvaginal ultrasound examinations were
performed for complete evaluation of the gestation as well as the
maternal uterus, adnexal regions, and pelvic cul-de-sac.
Transvaginal technique was performed to assess early pregnancy.

[Series 1: us ob < 14 weeks - us ob tv · 0.25mm/px · 14 of 87 slices shown]
[im 4/87]
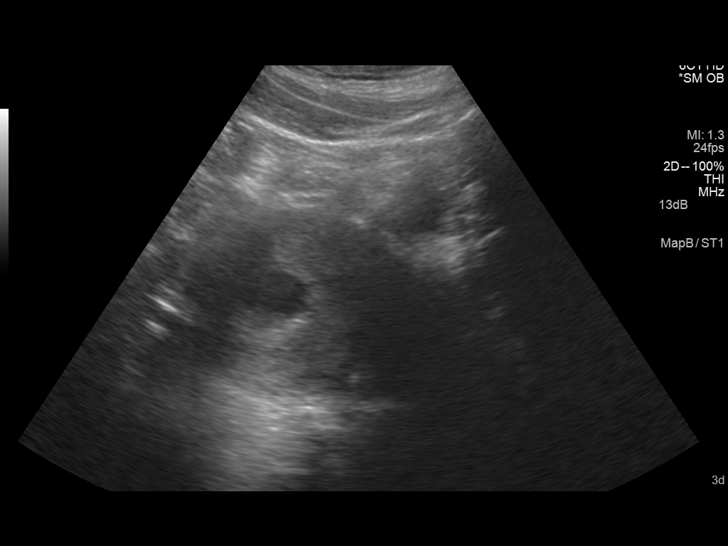
[im 10/87]
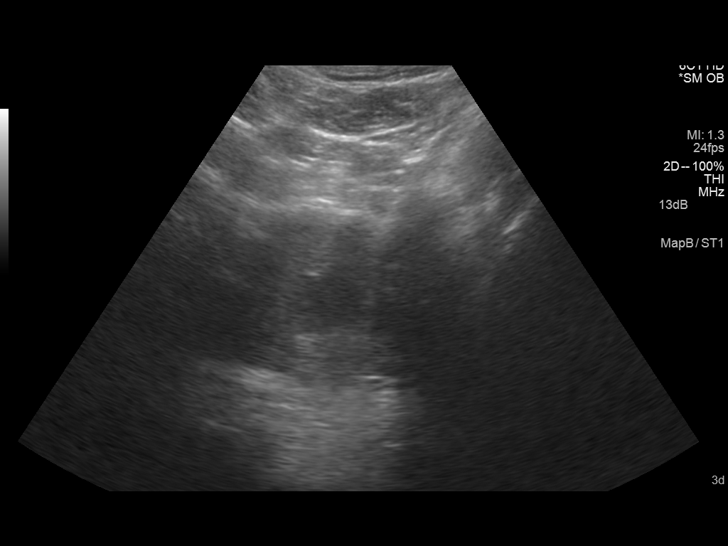
[im 16/87]
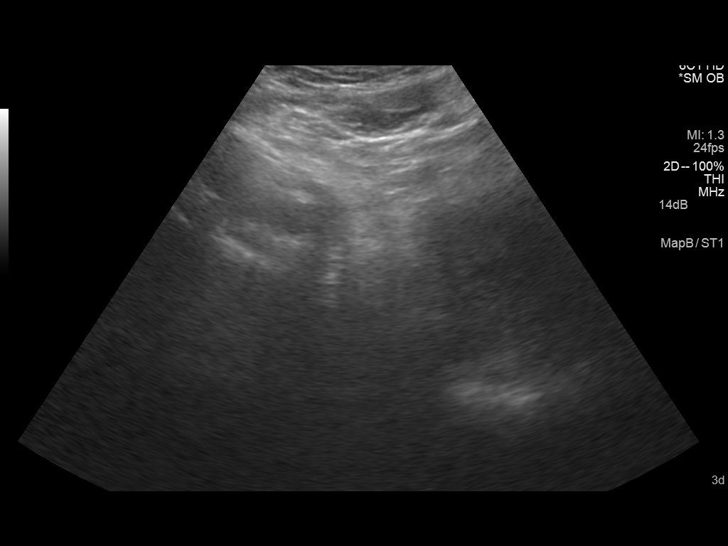
[im 23/87]
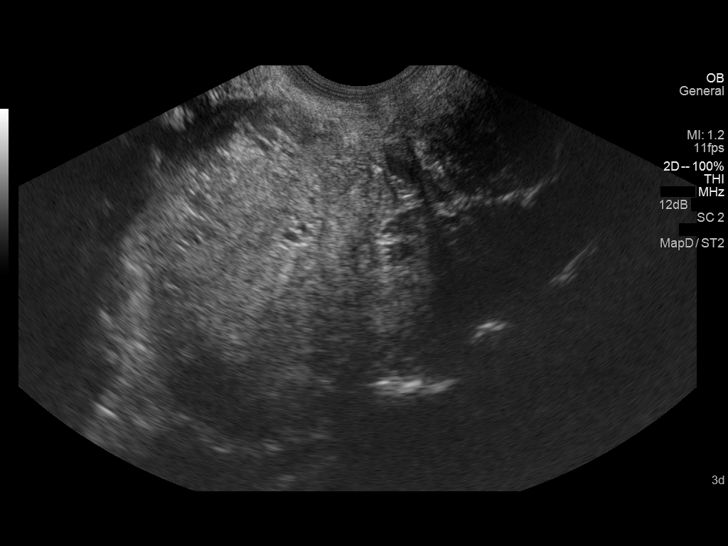
[im 29/87]
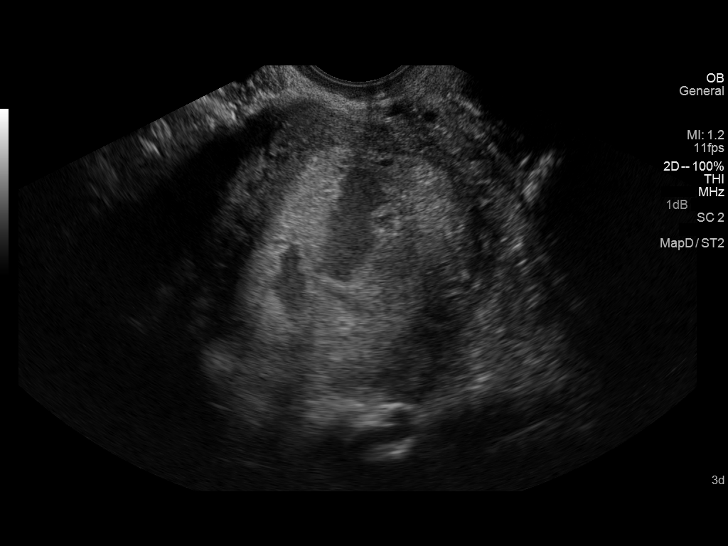
[im 36/87]
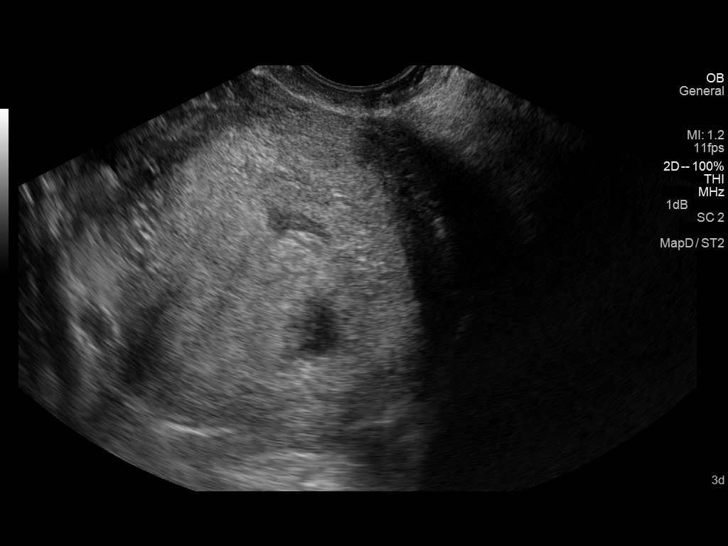
[im 42/87]
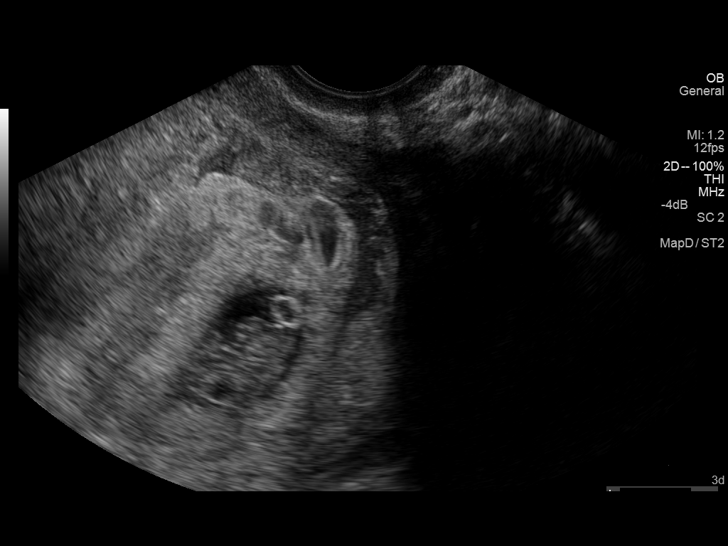
[im 48/87]
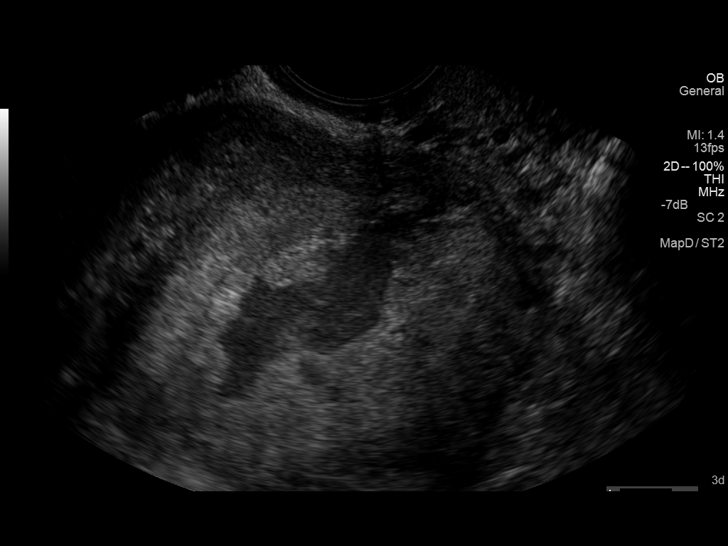
[im 55/87]
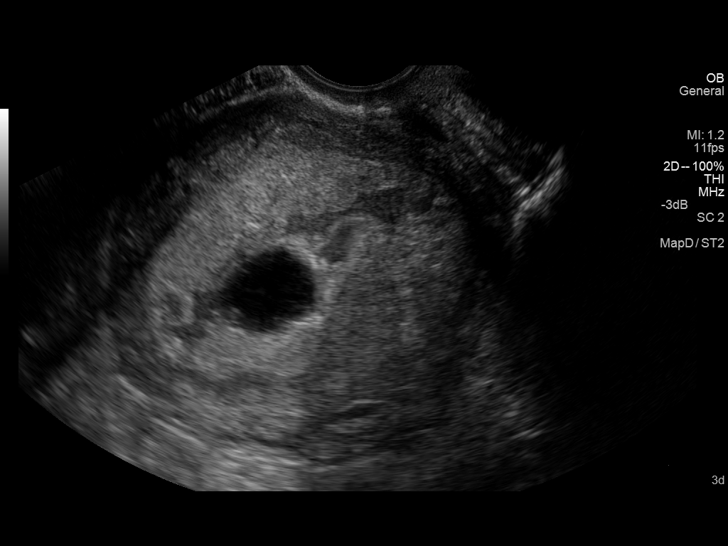
[im 61/87]
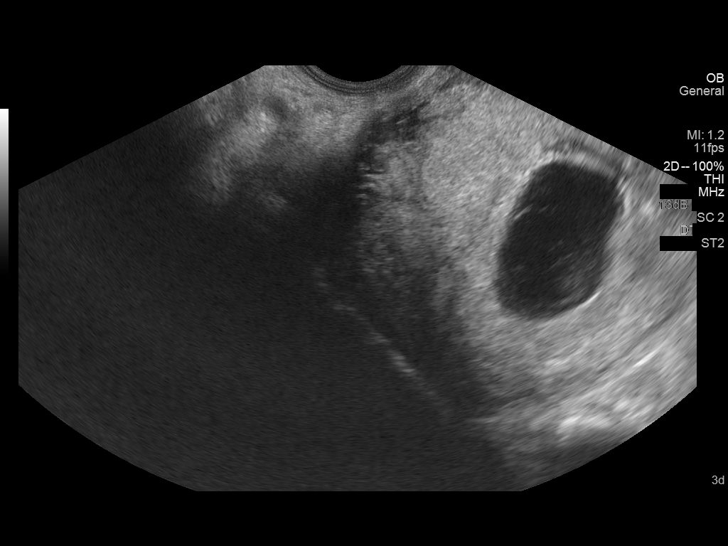
[im 67/87]
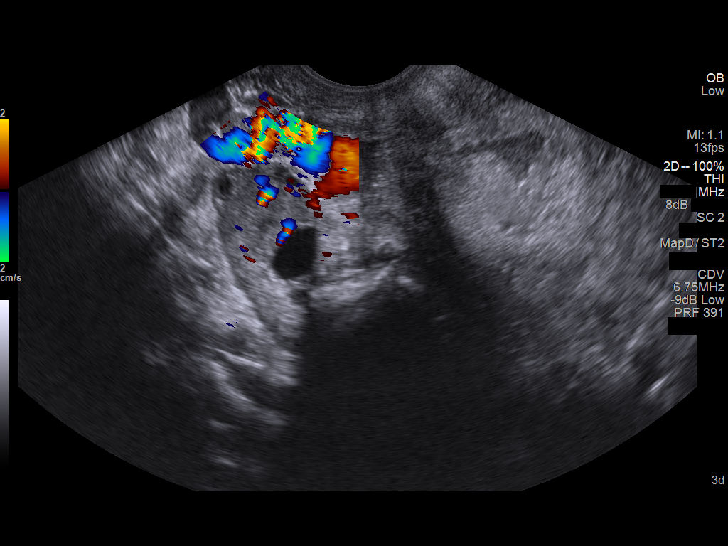
[im 74/87]
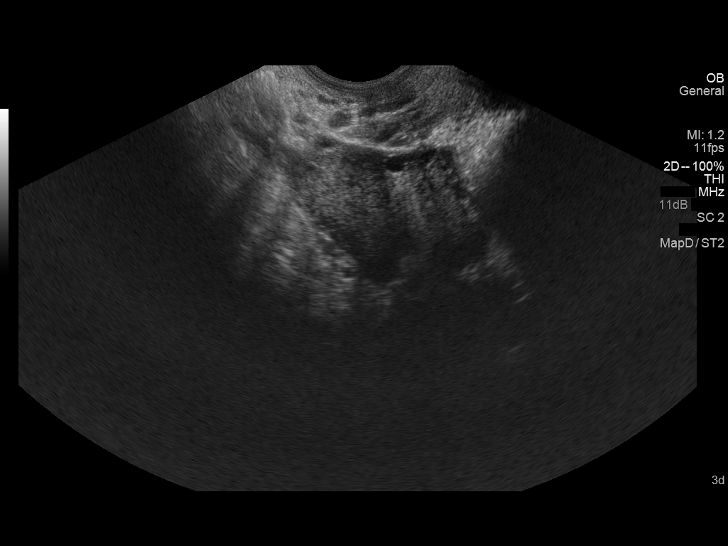
[im 80/87]
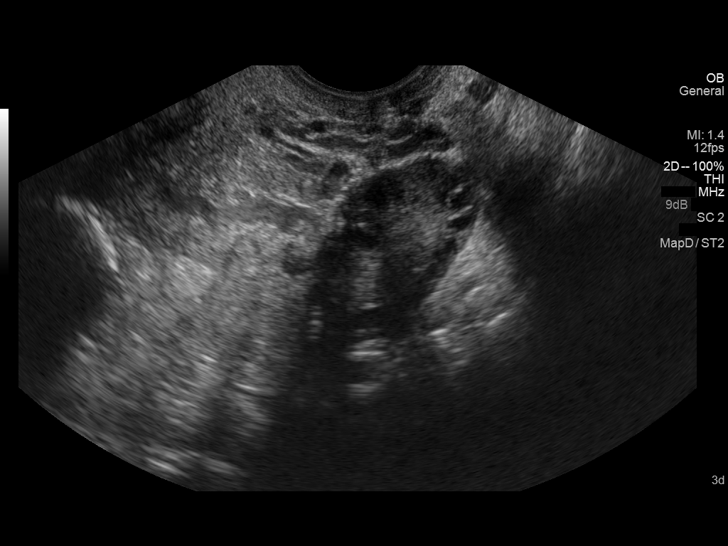
[im 87/87]
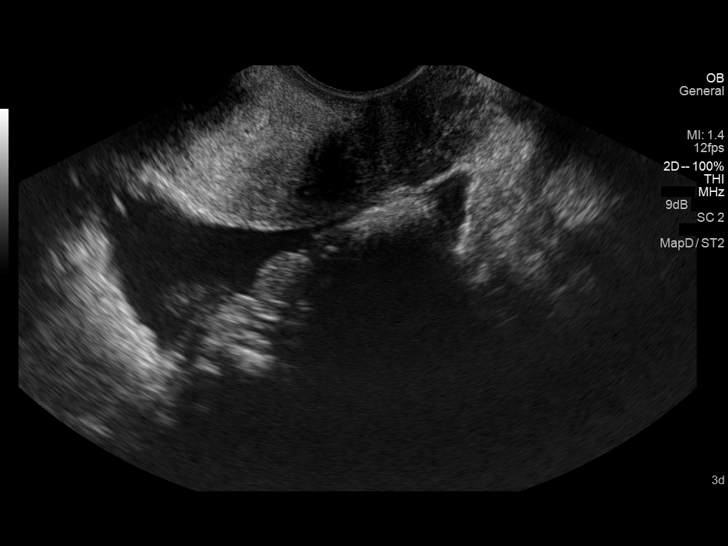

[14 of 28 positions shown; findings below may reference images not displayed]

FINDINGS: Intrauterine gestational sac: Present

Yolk sac:  Present

Embryo:  Present

Cardiac Activity: Present

Heart Rate:  169 bpm

CRL:   19  mm   8 w 3 d                  US EDC: 11/08/2014

Maternal uterus/adnexae: There is a moderate to large subchorionic
hemorrhage measuring up to 4.2 x 1.0 x 1.0 cm. Normal ovaries. Small
free fluid.
IMPRESSION: 1. Single intrauterine gestation with embryo and normal cardiac
activity.

2. Estimated gestational age by crown rump length equals 8 weeks 3
days.

3.  Moderate to large sub chronic hemorrhage.

## 2015-11-26 ENCOUNTER — Emergency Department
Admission: EM | Admit: 2015-11-26 | Discharge: 2015-11-26 | Disposition: A | Discharge status: Home / Self Care | Payer: Self-pay | Attending: Emergency Medicine | Admitting: Emergency Medicine

## 2015-11-26 ENCOUNTER — Encounter: Payer: Self-pay | Admitting: Emergency Medicine

## 2015-11-26 ENCOUNTER — Emergency Department: Payer: Self-pay

## 2015-11-26 DIAGNOSIS — Z79899 Other long term (current) drug therapy: Secondary | ICD-10-CM | POA: Insufficient documentation

## 2015-11-26 DIAGNOSIS — Z87891 Personal history of nicotine dependence: Secondary | ICD-10-CM | POA: Insufficient documentation

## 2015-11-26 DIAGNOSIS — Z791 Long term (current) use of non-steroidal anti-inflammatories (NSAID): Secondary | ICD-10-CM | POA: Insufficient documentation

## 2015-11-26 DIAGNOSIS — M5442 Lumbago with sciatica, left side: Secondary | ICD-10-CM | POA: Insufficient documentation

## 2015-11-26 LAB — POCT PREGNANCY, URINE: PREG TEST UR: NEGATIVE

## 2015-11-26 MED ORDER — MELOXICAM 15 MG PO TABS: 15.0000 mg | ORAL_TABLET | Freq: Every day | ORAL | 0 refills | Status: DC

## 2015-11-26 NOTE — ED Triage Notes (Signed)
Pt presents to ED c/o back pain that worsens with movement x3 weeks. Pt states yesterday she couldn't hold her daughter and had to sit down often. Pt states she has tried tylenol and stretching without relief.

## 2015-11-26 NOTE — ED Provider Notes (Signed)
[facility] Emergency Department Provider Note  ____________________________________________  Time seen: Approximately 4:16 PM  I have reviewed the triage vital signs and the nursing notes.   HISTORY  Chief Complaint Back Pain    HPI Margaret Ortega is a 30 y.o. female presents emergency department complaining of left lower back pain. Patient states that symptoms have been ongoing times several weeks. Patient reports that she has shooting pain from her lower back traveling down her left lower leg. She denies any injury precipitating this complaint. Patient states that it is a burning sensation. No bowel or bladder dysfunction, no cell anesthesia, no paresthesias. Patient has not tried any medications for this complaint prior to arrival.   Past Medical History:  Diagnosis Date  . Obesity     Patient Active Problem List   Diagnosis Date Noted  . Previous cesarean delivery, antepartum condition or complication 11/03/2014  . Labor and delivery, indication for care 10/03/2014  . Pregnant and not yet delivered 10/03/2014    Past Surgical History:  Procedure Laterality Date  . CESAREAN SECTION     breech presentation  . CESAREAN SECTION N/A 11/03/2014   Procedure: CESAREAN SECTION;  Surgeon: Elenora Fender Ward, MD;  Location: [facility] ORS;  Service: Obstetrics;  Laterality: N/A;    Prior to Admission medications   Medication Sig Start Date End Date Taking? Authorizing Provider  acetaminophen (TYLENOL) 500 MG tablet Take 1,000 mg by mouth every 6 (six) hours as needed.    Historical Provider, MD  docusate sodium (COLACE) 100 MG capsule Take 1 capsule (100 mg total) by mouth 2 (two) times daily as needed for mild constipation. 11/05/14   [place] Bing, MD  ibuprofen (ADVIL,MOTRIN) 600 MG tablet Take 1 tablet (600 mg total) by mouth every 6 (six) hours as needed. 11/05/14   [place] Bing, MD  meloxicam (MOBIC) 15 MG tablet Take 1 tablet (15 mg total) by mouth daily.  11/26/15   Delorise Royals Cuthriell, PA-C  Prenatal Vit-Fe Fumarate-FA (PRENATAL MULTIVITAMIN) TABS tablet Take 1 tablet by mouth daily at 12 noon. 11/05/14   [place] Bing, MD    Allergies Zyrtec [cetirizine]  Family History  Problem Relation Age of Onset  . Cancer    . Cancer Paternal Grandmother     ovarian    Social History Social History  Substance Use Topics  . Smoking status: Former Smoker    Quit date: 03/04/2014  . Smokeless tobacco: Never Used  . Alcohol use No     Review of Systems  Constitutional: No fever/chills Cardiovascular: no chest pain. Respiratory: no cough. No SOB. Musculoskeletal: Positive for left lower back pain radiating down left leg Skin: Negative for rash, abrasions, lacerations, ecchymosis. Neurological: Negative for headaches, focal weakness or numbness. 10-point ROS otherwise negative.  ____________________________________________   PHYSICAL EXAM:  VITAL SIGNS: ED Triage Vitals  Enc Vitals Group     BP 11/26/15 1404 126/83     Pulse Rate 11/26/15 1404 79     Resp 11/26/15 1404 20     Temp 11/26/15 1404 98 F (36.7 C)     Temp Source 11/26/15 1404 Oral     SpO2 11/26/15 1404 100 %     Weight 11/26/15 1405 205 lb (93 kg)     Height 11/26/15 1405 5\' 9"  (1.753 m)     Head Circumference --      Peak Flow --      Pain Score --      Pain Loc --  Pain Edu? --      Excl. in GC? --      Constitutional: Alert and oriented. Well appearing and in no acute distress. Eyes: Conjunctivae are normal. PERRL. EOMI. Head: Atraumatic. Cardiovascular: Normal rate, regular rhythm. Normal S1 and S2.  Good peripheral circulation. Respiratory: Normal respiratory effort without tachypnea or retractions. Lungs CTAB. Good air entry to the bases with no decreased or absent breath sounds. Gastrointestinal: Bowel sounds 4 quadrants. Soft and nontender to palpation. No guarding or rigidity. No palpable masses. No distention. No CVA  tenderness. Musculoskeletal: Full range of motion to all extremities. No gross deformities appreciated.No deformities to the spine upon inspection. Full range of motion lower back. Patient is diffusely tender to palpation in the left-sided paraspinal muscle group. She is tender to palpation over left sciatic notch. Negative straight leg raise bilaterally. Sensation intact to lower extremities. Dorsalis pupils intact and equal lower Shoney's. Neurologic:  Normal speech and language. No gross focal neurologic deficits are appreciated.  Skin:  Skin is warm, dry and intact. No rash noted. Psychiatric: Mood and affect are normal. Speech and behavior are normal. Patient exhibits appropriate insight and judgement.   ____________________________________________   LABS (all labs ordered are listed, but only abnormal results are displayed)  Labs Reviewed  POCT PREGNANCY, URINE   ____________________________________________  EKG   ____________________________________________  RADIOLOGY Festus Barren Cuthriell, personally viewed and evaluated these images (plain radiographs) as part of my medical decision making, as well as reviewing the written report by the radiologist.  Dg Lumbar Spine Complete  Result Date: 11/26/2015 CLINICAL DATA:  Low back pain x3 weeks, no known injury EXAM: LUMBAR SPINE - COMPLETE 4+ VIEW COMPARISON:  None. FINDINGS: Five lumbar type vertebral bodies. Normal lumbar lordosis. No evidence of fracture or dislocation. Vertebral body heights and intervertebral disc spaces are maintained. Visualized bony pelvis appears intact. IMPRESSION: Normal lumbar spine radiographs. Electronically Signed   By: Charline Bills M.D.   On: 11/26/2015 15:38    ____________________________________________    PROCEDURES  Procedure(s) performed:    Procedures    Medications - No data to display   ____________________________________________   INITIAL IMPRESSION / ASSESSMENT AND  PLAN / ED COURSE  Pertinent labs & imaging results that were available during my care of the patient were reviewed by me and considered in my medical decision making (see chart for details).  Review of the [place] CSRS was performed in accordance of the [facility] prior to dispensing any controlled drugs.  Clinical Course    Patient's diagnosis is consistent with Lumbago of the lower back with radicular symptoms down left leg. Exam is reassuring. X-ray reveals no acute osseous abnormality. No further imaging as deemed necessary at this time.. Patient will be discharged home with prescriptions for anti-inflammatories. Patient is to follow up with orthopedics as needed or otherwise directed. Patient is given ED precautions to return to the ED for any worsening or new symptoms.     ____________________________________________  FINAL CLINICAL IMPRESSION(S) / ED DIAGNOSES  Final diagnoses:  Midline low back pain with left-sided sciatica      NEW MEDICATIONS STARTED DURING THIS VISIT:  New Prescriptions   MELOXICAM (MOBIC) 15 MG TABLET    Take 1 tablet (15 mg total) by mouth daily.        This chart was dictated using voice recognition software/Dragon. Despite best efforts to proofread, errors can occur which can change the meaning. Any change was purely unintentional.    Delorise Royals Cuthriell,  PA-C 11/26/15 1623    Myrna Blazeravid Matthew Schaevitz, MD 11/26/15 [phone]

## 2016-02-26 ENCOUNTER — Emergency Department
Admission: EM | Admit: 2016-02-26 | Discharge: 2016-02-26 | Disposition: A | Discharge status: Home / Self Care | Payer: Self-pay | Attending: Emergency Medicine | Admitting: Emergency Medicine

## 2016-02-26 ENCOUNTER — Encounter: Payer: Self-pay | Admitting: Emergency Medicine

## 2016-02-26 DIAGNOSIS — B9689 Other specified bacterial agents as the cause of diseases classified elsewhere: Secondary | ICD-10-CM

## 2016-02-26 DIAGNOSIS — Z87891 Personal history of nicotine dependence: Secondary | ICD-10-CM | POA: Insufficient documentation

## 2016-02-26 DIAGNOSIS — Z79899 Other long term (current) drug therapy: Secondary | ICD-10-CM | POA: Insufficient documentation

## 2016-02-26 DIAGNOSIS — N760 Acute vaginitis: Secondary | ICD-10-CM | POA: Insufficient documentation

## 2016-02-26 DIAGNOSIS — N3090 Cystitis, unspecified without hematuria: Secondary | ICD-10-CM | POA: Insufficient documentation

## 2016-02-26 LAB — COMPREHENSIVE METABOLIC PANEL
ALBUMIN: 4 g/dL (ref 3.5–5.0)
ALT: 12 U/L — ABNORMAL LOW (ref 14–54)
AST: 18 U/L (ref 15–41)
Alkaline Phosphatase: 86 U/L (ref 38–126)
Anion gap: 6 (ref 5–15)
BILIRUBIN TOTAL: 0.2 mg/dL — AB (ref 0.3–1.2)
BUN: 14 mg/dL (ref 6–20)
CHLORIDE: 102 mmol/L (ref 101–111)
CO2: 31 mmol/L (ref 22–32)
Calcium: 9.2 mg/dL (ref 8.9–10.3)
Creatinine, Ser: 0.87 mg/dL (ref 0.44–1.00)
GFR calc Af Amer: >60 mL/min (ref >60)
GFR calc non Af Amer: >60 mL/min (ref >60)
GLUCOSE: 96 mg/dL (ref 65–99)
POTASSIUM: 3.6 mmol/L (ref 3.5–5.1)
Sodium: 139 mmol/L (ref 135–145)
TOTAL PROTEIN: 8.1 g/dL (ref 6.5–8.1)

## 2016-02-26 LAB — URINALYSIS COMPLETE WITH MICROSCOPIC (ARMC ONLY)
Bilirubin Urine: NEGATIVE
GLUCOSE, UA: NEGATIVE mg/dL
Ketones, ur: NEGATIVE mg/dL
Nitrite: NEGATIVE
Protein, ur: 100 mg/dL — AB
Specific Gravity, Urine: 1.025 (ref 1.005–1.030)
pH: 7 (ref 5.0–8.0)

## 2016-02-26 LAB — POCT PREGNANCY, URINE: Preg Test, Ur: NEGATIVE

## 2016-02-26 LAB — WET PREP, GENITAL
SPERM: NONE SEEN
TRICH WET PREP: NONE SEEN
YEAST WET PREP: NONE SEEN

## 2016-02-26 LAB — CBC
HEMATOCRIT: 40.5 % (ref 35.0–47.0)
Hemoglobin: 13.1 g/dL (ref 12.0–16.0)
MCH: 25.2 pg — ABNORMAL LOW (ref 26.0–34.0)
MCHC: 32.4 g/dL (ref 32.0–36.0)
MCV: 77.7 fL — AB (ref 80.0–100.0)
Platelets: 287 10*3/uL (ref 150–440)
RBC: 5.21 MIL/uL — ABNORMAL HIGH (ref 3.80–5.20)
RDW: 15.1 % — AB (ref 11.5–14.5)
WBC: 8.8 10*3/uL (ref 3.6–11.0)

## 2016-02-26 LAB — CHLAMYDIA/NGC RT PCR (ARMC ONLY)
Chlamydia Tr: NOT DETECTED
N gonorrhoeae: NOT DETECTED

## 2016-02-26 MED ORDER — CEPHALEXIN 500 MG PO CAPS: 500.0000 mg | ORAL_CAPSULE | Freq: Three times a day (TID) | ORAL | 0 refills | Status: AC

## 2016-02-26 MED ORDER — METRONIDAZOLE 500 MG PO TABS
500.0000 mg | ORAL_TABLET | Freq: Once | ORAL | Status: AC
  Administered 2016-02-26: 500 mg via ORAL
  Filled 2016-02-26: qty 1

## 2016-02-26 MED ORDER — CEPHALEXIN 500 MG PO CAPS
500.0000 mg | ORAL_CAPSULE | Freq: Once | ORAL | Status: AC
  Administered 2016-02-26: 500 mg via ORAL
  Filled 2016-02-26 (×2): qty 1

## 2016-02-26 MED ORDER — METRONIDAZOLE 500 MG PO TABS: 500.0000 mg | ORAL_TABLET | Freq: Two times a day (BID) | ORAL | 0 refills | Status: DC

## 2016-02-26 NOTE — ED Triage Notes (Signed)
Reports pain with urination and noticed blood in urine today

## 2016-02-26 NOTE — ED Provider Notes (Signed)
[facility] Emergency Department Provider Note  ____________________________________________   First MD Initiated Contact with Patient 02/26/16 1931     (approximate)  I have reviewed the triage vital signs and the nursing notes.   HISTORY  Chief Complaint Abdominal Pain   HPI Margaret Ortega is a 30 y.o. female who is presenting to the emergency department with 1 day of hematuria as well as dysuria. Says that she had an especially heavy. That started November 18 and lasted for about 6 days. [place] SpanielSaid that she had a small episode of bleeding thereafter which then resolved. Said that the burning with urination as well as pain started this morning. Denies any nausea, vomiting or diarrhea. Denies any pain at this time while at rest.   Past Medical History:  Diagnosis Date  . Obesity     Patient Active Problem List   Diagnosis Date Noted  . Previous cesarean delivery, antepartum condition or complication 11/03/2014  . Labor and delivery, indication for care 10/03/2014  . Pregnant and not yet delivered 10/03/2014    Past Surgical History:  Procedure Laterality Date  . CESAREAN SECTION     breech presentation  . CESAREAN SECTION N/A 11/03/2014   Procedure: CESAREAN SECTION;  Surgeon: Elenora Fenderhelsea C Ward, MD;  Location: [facility] ORS;  Service: Obstetrics;  Laterality: N/A;    Prior to Admission medications   Medication Sig Start Date End Date Taking? Authorizing Provider  acetaminophen (TYLENOL) 500 MG tablet Take 1,000 mg by mouth every 6 (six) hours as needed.    Historical Provider, MD  docusate sodium (COLACE) 100 MG capsule Take 1 capsule (100 mg total) by mouth 2 (two) times daily as needed for mild constipation. 11/05/14   [place] Bingharlie Pickens, MD  ibuprofen (ADVIL,MOTRIN) 600 MG tablet Take 1 tablet (600 mg total) by mouth every 6 (six) hours as needed. 11/05/14   [place] Bingharlie Pickens, MD  meloxicam (MOBIC) 15 MG tablet Take 1 tablet (15 mg total) by mouth daily. 11/26/15    Delorise RoyalsJonathan D Cuthriell, PA-C  Prenatal Vit-Fe Fumarate-FA (PRENATAL MULTIVITAMIN) TABS tablet Take 1 tablet by mouth daily at 12 noon. 11/05/14    Bingharlie Pickens, MD    Allergies Zyrtec [cetirizine]  Family History  Problem Relation Age of Onset  . Cancer    . Cancer Paternal Grandmother     ovarian    Social History Social History  Substance Use Topics  . Smoking status: Former Smoker    Quit date: 03/04/2014  . Smokeless tobacco: Never Used  . Alcohol use No    Review of Systems Constitutional: No fever/chills Eyes: No visual changes. ENT: No sore throat. Cardiovascular: Denies chest pain. Respiratory: Denies shortness of breath. Gastrointestinal:  No nausea, no vomiting.  No diarrhea.  No constipation. Genitourinary: As above Musculoskeletal: Negative for back pain. Skin: Negative for rash. Neurological: Negative for headaches, focal weakness or numbness.  10-point ROS otherwise negative.  ____________________________________________   PHYSICAL EXAM:  VITAL SIGNS: ED Triage Vitals  Enc Vitals Group     BP 02/26/16 1811 134/90     Pulse Rate 02/26/16 1811 78     Resp 02/26/16 1811 20     Temp 02/26/16 1811 98.5 F (36.9 C)     Temp Source 02/26/16 1811 Oral     SpO2 02/26/16 1811 100 %     Weight 02/26/16 1812 235 lb (106.6 kg)     Height 02/26/16 1812 5\' 11"  (1.803 m)     Head Circumference --  Peak Flow --      Pain Score 02/26/16 1812 2     Pain Loc --      Pain Edu? --      Excl. in GC? --     Constitutional: Alert and oriented. Well appearing and in no acute distress. Eyes: Conjunctivae are normal. PERRL. EOMI. Head: Atraumatic. Nose: No congestion/rhinnorhea. Mouth/Throat: Mucous membranes are moist.   Neck: No stridor.   Cardiovascular: Normal rate, regular rhythm. Grossly normal heart sounds.   Respiratory: Normal respiratory effort.  No retractions. Lungs CTAB. Gastrointestinal: Soft and nontender. No distention.  No CVA  tenderness. Genitourinary:  Normal external exam. Speculum exam with small amount of white discharge. Bimanual exam without CMT.  No uterine tenderness tenderness nor masses. Musculoskeletal: No lower extremity tenderness nor edema.  No joint effusions. Neurologic:  Normal speech and language. No gross focal neurologic deficits are appreciated. No gait instability. Skin:  Skin is warm, dry and intact. No rash noted. Psychiatric: Mood and affect are normal. Speech and behavior are normal.  ____________________________________________   LABS (all labs ordered are listed, but only abnormal results are displayed)  Labs Reviewed  COMPREHENSIVE METABOLIC PANEL - Abnormal; Notable for the following:       Result Value   ALT 12 (*)    Total Bilirubin 0.2 (*)    All other components within normal limits  CBC - Abnormal; Notable for the following:    RBC 5.21 (*)    MCV 77.7 (*)    MCH 25.2 (*)    RDW 15.1 (*)    All other components within normal limits  URINALYSIS COMPLETEWITH MICROSCOPIC ([facility] ONLY) - Abnormal; Notable for the following:    Color, Urine YELLOW (*)    APPearance HAZY (*)    Hgb urine dipstick 2+ (*)    Protein, ur 100 (*)    Leukocytes, UA TRACE (*)    Bacteria, UA RARE (*)    Squamous Epithelial / LPF 6-30 (*)    All other components within normal limits  WET PREP, GENITAL  CHLAMYDIA/NGC RT PCR ([facility] ONLY)  POCT PREGNANCY, URINE   ____________________________________________  EKG   ____________________________________________  RADIOLOGY   ____________________________________________   PROCEDURES  Procedure(s) performed:   Procedures  Critical Care performed:   ____________________________________________   INITIAL IMPRESSION / ASSESSMENT AND PLAN / ED COURSE  Pertinent labs & imaging results that were available during my care of the patient were reviewed by me and considered in my medical decision making (see chart for  details).   Clinical Course   ----------------------------------------- 9:33 PM on 02/26/2016 -----------------------------------------  Patient with evidence of bacterial vaginitis as well as possible hemorrhagic cystitis. We'll treat with Flexeril as well as Flagyl. Still pending gonorrhea and chlamydia swabs. Patient updated as to the plan. She is understanding and willing to comply. Will be discharged home. Much less likely to be kidney stone. No CVA tenderness palpation. Patient without any abdominal pain either. Finding a position of comfort and only pain with urination.   ____________________________________________   FINAL CLINICAL IMPRESSION(S) / ED DIAGNOSES  Bacterial vaginosis. Cystitis.    NEW MEDICATIONS STARTED DURING THIS VISIT:  New Prescriptions   No medications on file     Note:  This document was prepared using Dragon voice recognition software and may include unintentional dictation errors.    Myrna Blazeravid Matthew Sylvester Minton, MD 02/26/16 [phone]

## 2016-02-26 NOTE — ED Notes (Signed)
Pt states heavier then normal period that began 11/18, states more cramps and heavier bleeding then normal, states continued spotting after her period was over, states last night she felt pressure when trying to urinate, states pain with urination last night and blood tinged, pt awake and alert in no acute distress

## 2016-02-29 LAB — URINE CULTURE: Culture: >=100000 — AB

## 2016-09-09 ENCOUNTER — Ambulatory Visit (INDEPENDENT_AMBULATORY_CARE_PROVIDER_SITE_OTHER): Payer: Medicaid Other | Admitting: Advanced Practice Midwife

## 2016-09-09 ENCOUNTER — Encounter: Payer: Self-pay | Admitting: Advanced Practice Midwife

## 2016-09-09 VITALS — BP 100/60 | Wt 226.0 lb

## 2016-09-09 DIAGNOSIS — O34219 Maternal care for unspecified type scar from previous cesarean delivery: Secondary | ICD-10-CM

## 2016-09-09 DIAGNOSIS — Z3480 Encounter for supervision of other normal pregnancy, unspecified trimester: Secondary | ICD-10-CM

## 2016-09-09 DIAGNOSIS — Z98891 History of uterine scar from previous surgery: Secondary | ICD-10-CM

## 2016-09-09 DIAGNOSIS — Z3491 Encounter for supervision of normal pregnancy, unspecified, first trimester: Secondary | ICD-10-CM

## 2016-09-09 DIAGNOSIS — Z113 Encounter for screening for infections with a predominantly sexual mode of transmission: Secondary | ICD-10-CM

## 2016-09-09 LAB — POCT URINE PREGNANCY: PREG TEST UR: POSITIVE — AB

## 2016-09-09 NOTE — Patient Instructions (Signed)

## 2016-09-10 DIAGNOSIS — Z98891 History of uterine scar from previous surgery: Secondary | ICD-10-CM | POA: Insufficient documentation

## 2016-09-10 DIAGNOSIS — O34219 Maternal care for unspecified type scar from previous cesarean delivery: Secondary | ICD-10-CM | POA: Insufficient documentation

## 2016-09-10 DIAGNOSIS — Z3480 Encounter for supervision of other normal pregnancy, unspecified trimester: Secondary | ICD-10-CM | POA: Insufficient documentation

## 2016-09-10 NOTE — Assessment & Plan Note (Signed)
[facility] Prenatal Labs  Dating  Blood type:     Genetic Screen 1 Screen:    AFP:     Quad:     NIPS: Antibody:   Anatomic US  Rubella:   Varicella: I  GTT Early:               Third trimester:  RPR:     Rhogam  HBsAg:     TDaP vaccine                       Flu Shot: HIV:     Baby Food                                GBS:   Contraception  Pap:  CBB     CS/VBAC    Support Person         

## 2016-09-10 NOTE — Progress Notes (Signed)
New Obstetric Patient H&P    Chief Complaint: "Desires prenatal care"   History of Present Illness: Patient is a 31 y.o. [id number] Not Hispanic or Latino female, LMP 07/08/2016 presents with amenorrhea and positive home pregnancy test. Based on her  LMP, her EDD is Estimated Date of Delivery: 04/14/17 and her EGA is [redacted]w[redacted]d. Cycles are 6. days, regular, and occur approximately every : 28 days. Her last pap smear was 8 months ago and was no abnormalities.    She had a urine pregnancy test which was positive 3 or 4 week(s)  ago. Her last menstrual period was normal and lasted for  6 day(s). Since her LMP she claims she has experienced breast tenderness, fatigue, nausea. She denies vaginal bleeding. Her past medical history is complicated by obesity, smoking in early pregnancy prior to positive pregnancy test. Her prior pregnancies are notable for c/section with first pregnancy followed a miscarriage and then by VBAC  Since her LMP, she admits to the use of tobacco products  She quit with positive pregnancy test She claims she has gained   4 pounds since the start of her pregnancy.  There are cats in the home in the home  no  She admits close contact with children on a regular basis  yes  She has had chicken pox in the past yes She has had Tuberculosis exposures, symptoms, or previously tested positive for TB   no Current or past history of domestic violence. no  Genetic Screening/Teratology Counseling: (Includes patient, baby's father, or anyone in either family with:)   1. Patient's age >/= 28 [facility]  no 2. Thalassemia ([place], [place], Mediterranean, or Asian background): MCV<80  no 3. Neural tube defect (meningomyelocele, spina bifida, anencephaly)  no 4. Congenital heart defect  no  5. Down syndrome  no 6. Tay-Sachs (Jewish, [place]  no 7. Canavan's Disease  no 8. Sickle cell disease or trait (African)  no  9. Hemophilia or other blood disorders  no  10. Muscular dystrophy   no  11. Cystic fibrosis  no  12. Huntington's Chorea  no  13. Mental retardation/autism  no 14. Other inherited genetic or chromosomal disorder  no 15. Maternal metabolic disorder (DM, PKU, etc)  no 16. Patient or FOB with a child with a birth defect not listed above no  16a. Patient or FOB with a birth defect themselves no 17. Recurrent pregnancy loss, or stillbirth  no  18. Any medications since LMP other than prenatal vitamins (include vitamins, supplements, OTC meds, drugs, alcohol)  no 19. Any other genetic/environmental exposure to discuss  no  Infection History:   1. Lives with someone with TB or TB exposed  no  2. Patient or partner has history of genital herpes  no 3. Rash or viral illness since LMP  no 4. History of STI (GC, CT, HPV, syphilis, HIV)  no 5. History of recent travel :  no  Other pertinent information:  no     Review of Systems:10 point review of systems negative unless otherwise noted in HPI  Past Medical History:  Past Medical History:  Diagnosis Date  . Obesity     Past Surgical History:  Past Surgical History:  Procedure Laterality Date  . CESAREAN SECTION     breech presentation  . CESAREAN SECTION N/A 11/03/2014   Procedure: CESAREAN SECTION;  Surgeon: Elenora Fender Ward, MD;  Location: [facility] ORS;  Service: Obstetrics;  Laterality: N/A;    Gynecologic  History: Patient's last menstrual period was 07/08/2016.  Obstetric History: [id number]  Family History:  Family History  Problem Relation Age of Onset  . Cancer Unknown   . Cancer Paternal Grandmother        ovarian  . Ovarian cancer Paternal Grandmother     Social History:  Social History   Social History  . Marital status: Married    Spouse name: N/A  . Number of children: N/A  . Years of education: N/A   Occupational History  . Not on file.   Social History Main Topics  . Smoking status: Former Smoker    Quit date: 03/04/2014  . Smokeless tobacco: Never Used  . Alcohol use No   . Drug use: No  . Sexual activity: Yes   Other Topics Concern  . Not on file   Social History Narrative  . No narrative on file    Allergies:  Allergies  Allergen Reactions  . Zyrtec [Cetirizine] Rash    Medications: Prior to Admission medications   Medication Sig Start Date End Date Taking? Authorizing Provider  Prenatal Vit-Fe Fumarate-FA (PRENATAL MULTIVITAMIN) TABS tablet Take 1 tablet by mouth daily at 12 noon. 11/05/14  Yes [place] BingPickens, Charlie, MD  acetaminophen (TYLENOL) 500 MG tablet Take 1,000 mg by mouth every 6 (six) hours as needed.    [provider]    Physical Exam Vitals: Blood pressure 100/60, weight 226 lb (102.5 kg), last menstrual period 07/08/2016, unknown if currently breastfeeding.  General: NAD HEENT: normocephalic, anicteric Thyroid: no enlargement, no palpable nodules Pulmonary: No increased work of breathing, CTAB Cardiovascular: RRR, distal pulses 2+ Abdomen: NABS, soft, non-tender, non-distended.  Umbilicus without lesions.  No hepatomegaly, splenomegaly or masses palpable. No evidence of hernia  Genitourinary:  External: Normal external female genitalia.  Normal urethral meatus, normal  Bartholin's and Skene's glands.    Vagina: Normal vaginal mucosa, no evidence of prolapse.    Cervix: Grossly normal in appearance, no bleeding, no CMT  Uterus: Enlarged, mobile, normal contour.    Adnexa: ovaries non-enlarged, no adnexal masses  Rectal: deferred Extremities: no edema, erythema, or tenderness Neurologic: Grossly intact Psychiatric: mood appropriate, affect full   Assessment: 31 y.o. [id number] at 7159w1d presenting to initiate prenatal care  Plan: 1) Avoid alcoholic beverages. 2) Patient encouraged not to smoke.  3) Discontinue the use of all non-medicinal drugs and chemicals.  4) Take prenatal vitamins daily.  5) Nutrition, food safety (fish, cheese advisories, and high nitrite foods) and exercise discussed. 6) Hospital and practice  style discussed with cross coverage system.  7) Genetic Screening, such as with 1st Trimester Screening, cell free fetal DNA, AFP testing, and Ultrasound, as well as with amniocentesis and CVS as appropriate, is discussed with patient. At the conclusion of today's visit patient requested genetic testing 8) Patient is asked about travel to areas at risk for the Zika virus, and counseled to avoid travel and exposure to mosquitoes or sexual partners who may have themselves been exposed to the virus. Testing is discussed, and will be ordered as appropriate.  9) NOB blood work including early 1 hr gtt, sickle cell screen, 1st trimester screen to be done at 1st trimester screen visit. Offer My Risk test at same visit for family history of ovarian cancer   Tresea MallJane Zelie Asbill, CNM

## 2016-09-11 LAB — URINE CULTURE

## 2016-09-11 LAB — GC/CHLAMYDIA PROBE AMP
Chlamydia trachomatis, NAA: NEGATIVE
NEISSERIA GONORRHOEAE BY PCR: NEGATIVE

## 2016-09-19 ENCOUNTER — Ambulatory Visit (INDEPENDENT_AMBULATORY_CARE_PROVIDER_SITE_OTHER): Payer: Medicaid Other

## 2016-09-19 ENCOUNTER — Ambulatory Visit (INDEPENDENT_AMBULATORY_CARE_PROVIDER_SITE_OTHER): Payer: Medicaid Other | Admitting: Advanced Practice Midwife

## 2016-09-19 ENCOUNTER — Encounter: Payer: Self-pay | Admitting: Advanced Practice Midwife

## 2016-09-19 VITALS — BP 112/74 | Wt 231.0 lb

## 2016-09-19 DIAGNOSIS — Z3491 Encounter for supervision of normal pregnancy, unspecified, first trimester: Secondary | ICD-10-CM | POA: Diagnosis not present

## 2016-09-19 DIAGNOSIS — Z131 Encounter for screening for diabetes mellitus: Secondary | ICD-10-CM

## 2016-09-19 DIAGNOSIS — Z3682 Encounter for antenatal screening for nuchal translucency: Secondary | ICD-10-CM

## 2016-09-19 DIAGNOSIS — Z362 Encounter for other antenatal screening follow-up: Secondary | ICD-10-CM | POA: Diagnosis not present

## 2016-09-19 DIAGNOSIS — Z3A10 10 weeks gestation of pregnancy: Secondary | ICD-10-CM

## 2016-09-19 DIAGNOSIS — Z113 Encounter for screening for infections with a predominantly sexual mode of transmission: Secondary | ICD-10-CM

## 2016-09-19 DIAGNOSIS — Z3480 Encounter for supervision of other normal pregnancy, unspecified trimester: Secondary | ICD-10-CM

## 2016-09-19 DIAGNOSIS — Z6831 Body mass index (BMI) 31.0-31.9, adult: Secondary | ICD-10-CM

## 2016-09-19 DIAGNOSIS — Z1379 Encounter for other screening for genetic and chromosomal anomalies: Secondary | ICD-10-CM

## 2016-09-19 DIAGNOSIS — E669 Obesity, unspecified: Secondary | ICD-10-CM

## 2016-09-19 MED ORDER — CITRANATAL 90 DHA 90-1 & 300 MG PO MISC
1.0000 | Freq: Every day | ORAL | 10 refills | Status: AC
Start: 2016-09-19 — End: ?

## 2016-09-19 NOTE — Progress Notes (Signed)
Dating scan today = LMP. C/o nausea- sample of bonjesta given. NOB labs including early 1 hr gtt and 1st trimester screen in 2 weeks.

## 2016-09-19 NOTE — Progress Notes (Signed)
Dating scan today. Nausea.

## 2016-10-03 ENCOUNTER — Other Ambulatory Visit: Payer: Medicaid Other

## 2016-10-03 ENCOUNTER — Ambulatory Visit (INDEPENDENT_AMBULATORY_CARE_PROVIDER_SITE_OTHER): Payer: Medicaid Other | Admitting: Advanced Practice Midwife

## 2016-10-03 ENCOUNTER — Ambulatory Visit (INDEPENDENT_AMBULATORY_CARE_PROVIDER_SITE_OTHER): Payer: Medicaid Other

## 2016-10-03 VITALS — BP 114/74 | Wt 232.0 lb

## 2016-10-03 DIAGNOSIS — E669 Obesity, unspecified: Secondary | ICD-10-CM

## 2016-10-03 DIAGNOSIS — Z6831 Body mass index (BMI) 31.0-31.9, adult: Secondary | ICD-10-CM

## 2016-10-03 DIAGNOSIS — Z3A13 13 weeks gestation of pregnancy: Secondary | ICD-10-CM

## 2016-10-03 DIAGNOSIS — Z3480 Encounter for supervision of other normal pregnancy, unspecified trimester: Secondary | ICD-10-CM

## 2016-10-03 DIAGNOSIS — Z1379 Encounter for other screening for genetic and chromosomal anomalies: Secondary | ICD-10-CM

## 2016-10-03 DIAGNOSIS — Z3A12 12 weeks gestation of pregnancy: Secondary | ICD-10-CM

## 2016-10-03 DIAGNOSIS — Z3682 Encounter for antenatal screening for nuchal translucency: Secondary | ICD-10-CM

## 2016-10-03 DIAGNOSIS — Z131 Encounter for screening for diabetes mellitus: Secondary | ICD-10-CM

## 2016-10-03 DIAGNOSIS — Z113 Encounter for screening for infections with a predominantly sexual mode of transmission: Secondary | ICD-10-CM

## 2016-10-03 NOTE — Progress Notes (Signed)
NOB, 1st trimester, sickle cell labs today. Unable to complete 1 hr gtt due to timer error. Will re-schedule for early next week. Otherwise doing fine. Return to clinic in 4 weeks for rob. No LOF, VB.

## 2016-10-03 NOTE — Progress Notes (Signed)
NT screen today.

## 2016-10-07 ENCOUNTER — Other Ambulatory Visit: Payer: Medicaid Other

## 2016-10-10 LAB — HEMOGLOBINOPATHY EVALUATION
HEMOGLOBIN A2 QUANTITATION: 2.4 % (ref 1.8–3.2)
HEMOGLOBIN F QUANTITATION: 0 % (ref 0.0–2.0)
HGB A: 97.6 % (ref 96.4–98.8)
HGB C: 0 %
HGB S: 0 %
HGB VARIANT: 0 %

## 2016-10-10 LAB — RPR+RH+ABO+RUB AB+AB SCR+CB...
Antibody Screen: NEGATIVE
HEMOGLOBIN: 11.9 g/dL (ref 11.1–15.9)
HEP B S AG: NEGATIVE
HIV Screen 4th Generation wRfx: NONREACTIVE
Hematocrit: 35.7 % (ref 34.0–46.6)
MCH: 25.3 pg — ABNORMAL LOW (ref 26.6–33.0)
MCHC: 33.3 g/dL (ref 31.5–35.7)
MCV: 76 fL — ABNORMAL LOW (ref 79–97)
Platelets: 281 10*3/uL (ref 150–379)
RBC: 4.71 x10E6/uL (ref 3.77–5.28)
RDW: 16.3 % — AB (ref 12.3–15.4)
RPR Ser Ql: NONREACTIVE
Rh Factor: POSITIVE
Rubella Antibodies, IGG: 1.43 index (ref >0.99)
VARICELLA: 146 {index} — AB (ref >165)
WBC: 7.8 10*3/uL (ref 3.4–10.8)

## 2016-10-14 LAB — FIRST TRIMESTER SCREEN W/NT
CRL: 69.5 mm
DIA MOM: 2
DIA VALUE: 348.6 pg/mL
Gest Age-Collect: 13 weeks
HCG MOM: 2.17
Maternal Age At EDD: 30.3 yr
Nuchal Translucency MoM: 1.05
Nuchal Translucency: 1.8 mm
Number of Fetuses: 1
PAPP-A MOM: 1.32
PAPP-A Value: 869.6 ng/mL
TEST RESULTS: NEGATIVE
Weight: 232 [lb_av]
hCG Value: 135.2 IU/mL

## 2016-10-23 ENCOUNTER — Emergency Department
Admission: EM | Admit: 2016-10-23 | Discharge: 2016-10-23 | Disposition: A | Discharge status: Home / Self Care | Payer: Medicaid Other | Attending: Emergency Medicine | Admitting: Emergency Medicine

## 2016-10-23 ENCOUNTER — Encounter: Payer: Self-pay | Admitting: Emergency Medicine

## 2016-10-23 DIAGNOSIS — F17200 Nicotine dependence, unspecified, uncomplicated: Secondary | ICD-10-CM | POA: Insufficient documentation

## 2016-10-23 DIAGNOSIS — R55 Syncope and collapse: Secondary | ICD-10-CM | POA: Insufficient documentation

## 2016-10-23 DIAGNOSIS — O26812 Pregnancy related exhaustion and fatigue, second trimester: Secondary | ICD-10-CM | POA: Insufficient documentation

## 2016-10-23 DIAGNOSIS — Z79899 Other long term (current) drug therapy: Secondary | ICD-10-CM | POA: Diagnosis not present

## 2016-10-23 DIAGNOSIS — Z3A15 15 weeks gestation of pregnancy: Secondary | ICD-10-CM | POA: Diagnosis not present

## 2016-10-23 LAB — CBC
HCT: 36.6 % (ref 35.0–47.0)
HEMOGLOBIN: 12.1 g/dL (ref 12.0–16.0)
MCH: 26 pg (ref 26.0–34.0)
MCHC: 33.1 g/dL (ref 32.0–36.0)
MCV: 78.5 fL — ABNORMAL LOW (ref 80.0–100.0)
PLATELETS: 238 10*3/uL (ref 150–440)
RBC: 4.66 MIL/uL (ref 3.80–5.20)
RDW: 15.2 % — ABNORMAL HIGH (ref 11.5–14.5)
WBC: 7.3 10*3/uL (ref 3.6–11.0)

## 2016-10-23 LAB — URINALYSIS, COMPLETE (UACMP) WITH MICROSCOPIC
Bacteria, UA: NONE SEEN
Bilirubin Urine: NEGATIVE
GLUCOSE, UA: NEGATIVE mg/dL
HGB URINE DIPSTICK: NEGATIVE
Ketones, ur: NEGATIVE mg/dL
Leukocytes, UA: NEGATIVE
NITRITE: NEGATIVE
PH: 6 (ref 5.0–8.0)
PROTEIN: NEGATIVE mg/dL
RBC / HPF: NONE SEEN RBC/hpf (ref 0–5)
Specific Gravity, Urine: 1.025 (ref 1.005–1.030)

## 2016-10-23 LAB — BASIC METABOLIC PANEL
ANION GAP: 7 (ref 5–15)
BUN: 11 mg/dL (ref 6–20)
CALCIUM: 9.6 mg/dL (ref 8.9–10.3)
CO2: 25 mmol/L (ref 22–32)
CREATININE: 0.79 mg/dL (ref 0.44–1.00)
Chloride: 102 mmol/L (ref 101–111)
Glucose, Bld: 106 mg/dL — ABNORMAL HIGH (ref 65–99)
Potassium: 4.1 mmol/L (ref 3.5–5.1)
Sodium: 134 mmol/L — ABNORMAL LOW (ref 135–145)

## 2016-10-23 MED ORDER — SODIUM CHLORIDE 0.9 % IV BOLUS (SEPSIS)
1000.0000 mL | Freq: Once | INTRAVENOUS | Status: AC
  Administered 2016-10-23: 1000 mL via INTRAVENOUS

## 2016-10-23 NOTE — ED Provider Notes (Signed)
[facility] Emergency Department Provider Note  ____________________________________________   I have reviewed the triage vital signs and the nursing notes.   HISTORY  Chief Complaint Loss of Consciousness    HPI Margaret Ortega is a 31 y.o. female with a chronic history of syncope. She states she has passed out "more times that she can count." After tilt table, cardiology, EEG, monitoring which sounds like Holter monitoring,and multiple other tests, she was diagnosed with vasovagal syncope. She states the last time she had it fortunately was about 10 years ago when she was. With her last daughter. 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 She knew that she might pass out so she sat down. She had no chest pain or shortness of breath. She briefly passed out with no trauma, she woke up with no postictal phase. Witnesses did not describe seizure activity. She feels 100% back to normal. She would eat and drink. Patient is [redacted] weeks pregnant. She's had a normal ultrasound. She denies abdominal pain or vaginal bleeding. She does not feel lightheaded. She states this feels exactly like her prior syncopal events. She does not want to stay in the hospital, she would like to go home.     Past Medical History:  Diagnosis Date  . Obesity   Syncope  Patient Active Problem List   Diagnosis Date Noted  . Supervision of other normal pregnancy, antepartum 09/10/2016  . History of cesarean section 09/10/2016  . History of successful vaginal birth after cesarean, currently pregnant 09/10/2016    Past Surgical History:  Procedure Laterality Date  . CESAREAN SECTION     breech presentation  . CESAREAN SECTION N/A 11/03/2014   Procedure: CESAREAN SECTION;  Surgeon: Elenora Fenderhelsea C Ward, MD;  Location: [facility] ORS;  Service: Obstetrics;  Laterality: N/A;    Prior to Admission medications   Medication Sig Start Date End Date  Taking? Authorizing Provider  acetaminophen (TYLENOL) 500 MG tablet Take 1,000 mg by mouth every 6 (six) hours as needed.    [provider]  Prenat w/o A-FeCbGl-DSS-FA-DHA (CITRANATAL 90 DHA) 90-1 & 300 MG MISC Take 1 tablet by mouth daily. 09/19/16   Tresea MallGledhill, Jane, CNM  Prenatal Vit-Fe Fumarate-FA (PRENATAL MULTIVITAMIN) TABS tablet Take 1 tablet by mouth daily at 12 noon. 11/05/14   [place] BingPickens, Charlie, MD    Allergies Zyrtec [cetirizine]  Family History  Problem Relation Age of Onset  . Cancer Unknown   . Cancer Paternal Grandmother        ovarian  . Ovarian cancer Paternal Grandmother     Social History Social History  Substance Use Topics  . Smoking status: Former Smoker    Quit date: 03/04/2014  . Smokeless tobacco: Never Used  . Alcohol use No    Review of Systems Constitutional: No fever/chills Eyes: No visual changes. ENT: No sore throat. No stiff neck no neck pain Cardiovascular: Denies chest pain. Respiratory: Denies shortness of breath. Gastrointestinal:   no vomiting.  No diarrhea.  No constipation. Genitourinary: Negative for dysuria. Musculoskeletal: Negative lower extremity swelling Skin: Negative for rash. Neurological: Negative for severe headaches, focal weakness or numbness.   ____________________________________________   PHYSICAL EXAM:  VITAL SIGNS: ED Triage Vitals [10/23/16 1322]  Enc Vitals Group     BP 100/64     Pulse Rate 70     Resp 18     Temp 98.3 F (36.8 C)  Temp Source Oral     SpO2 100 %     Weight 230 lb (104.3 kg)     Height 5\' 11"  (1.803 m)     Head Circumference      Peak Flow      Pain Score 10     Pain Loc      Pain Edu?      Excl. in GC?     Constitutional: Alert and oriented. Well appearing and in no acute distress. Eyes: Conjunctivae are normal Head: Atraumatic HEENT: No congestion/rhinnorhea. Mucous membranes are moist.  Oropharynx non-erythematous Neck:   Nontender with no meningismus, no  masses, no stridor Cardiovascular: Normal rate, regular rhythm. Grossly normal heart sounds.  Good peripheral circulation. Respiratory: Normal respiratory effort.  No retractions. Lungs CTAB. Abdominal: Soft and nontender. No distention. No guarding no rebound Back:  There is no focal tenderness or step off.  there is no midline tenderness there are no lesions noted. there is no CVA tenderness Musculoskeletal: No lower extremity tenderness, no upper extremity tenderness. No joint effusions, no DVT signs strong distal pulses no edema Neurologic:  Normal speech and language. No gross focal neurologic deficits are appreciated.  Skin:  Skin is warm, dry and intact. No rash noted. Psychiatric: Mood and affect are normal. Speech and behavior are normal.  ____________________________________________   LABS (all labs ordered are listed, but only abnormal results are displayed)  Labs Reviewed  BASIC METABOLIC PANEL - Abnormal; Notable for the following:       Result Value   Sodium 134 (*)    Glucose, Bld 106 (*)    All other components within normal limits  CBC - Abnormal; Notable for the following:    MCV 78.5 (*)    RDW 15.2 (*)    All other components within normal limits  URINALYSIS, COMPLETE (UACMP) WITH MICROSCOPIC - Abnormal; Notable for the following:    Color, Urine YELLOW (*)    APPearance HAZY (*)    Squamous Epithelial / LPF 0-5 (*)    All other components within normal limits  CBG MONITORING, ED   ____________________________________________  EKG  I personally interpreted any EKGs ordered by me or triage Normal sinus rhythm rate 73 beats per an 8, QRS is 85, QTc is 422 no ischemic changes ____________________________________________  RADIOLOGY  I reviewed any imaging ordered by me or triage that were performed during my shift and, if possible, patient and/or family made aware of any abnormal  findings. ____________________________________________   PROCEDURES  Procedure(s) performed: None  Procedures  Critical Care performed: None  ____________________________________________   INITIAL IMPRESSION / ASSESSMENT AND PLAN / ED COURSE  Pertinent labs & imaging results that were available during my care of the patient were reviewed by me and considered in my medical decision making (see chart for details).  Patient with a long history of syncope, got "hot" and passed out for brief seconds. She was getting lightheaded beforehand. She had no exertional symptoms. She has Bactroban baseline. She is pregnant. This is only when this happens. She is not lightheaded. She has no ongoing symptoms. She declines admission. Her Is reassuring here. Nothing to suggest ACS PE or dissection nothing to suggest that there is a problem with the pregnancy or pregnancy related event. Normal fetal heart tones, history of normal ultrasound for this urgency as well. Patient has no complaints. We will discharge her with close outpatient follow-up with her OB/GYN's. I've advised her not to drive until cleared by physicians.  ____________________________________________   FINAL CLINICAL IMPRESSION(S) / ED DIAGNOSES  Final diagnoses:  None      This chart was dictated using voice recognition software.  Despite best efforts to proofread,  errors can occur which can change meaning.      Jeanmarie PlantMcShane, Aleece Loyd A, MD 10/23/16 [phone]

## 2016-10-23 NOTE — Discharge Instructions (Signed)
Drink plenty of fluids, return to the emergency room for any new or worrisome symptoms including chest pain, vaginal bleeding, abdominal pain, lightheadedness that is significant, or anything else of concern including fever. Do not drive, soak in the tub, or do anything else which, if interrupted by passing out, could result in harm to you, until cleared by your doctors.

## 2016-10-23 NOTE — ED Triage Notes (Signed)
Pt via ems from work after syncopal episode. Pt states she has hx of such events, but that the last one was over 10 years ago. Pt is [redacted] weeks pregnant. States she felt really hot and then woke up. Pt reports feeling more tired than usual in last few days. Pt alert & oriented, reports HA 10/10.

## 2016-10-23 NOTE — ED Notes (Signed)
Pt discharged home after verbalizing understanding of discharge instructions; nad noted. 

## 2016-10-31 ENCOUNTER — Ambulatory Visit (INDEPENDENT_AMBULATORY_CARE_PROVIDER_SITE_OTHER): Payer: Medicaid Other | Admitting: Advanced Practice Midwife

## 2016-10-31 ENCOUNTER — Other Ambulatory Visit: Payer: Medicaid Other

## 2016-10-31 VITALS — BP 118/74 | Wt 232.0 lb

## 2016-10-31 DIAGNOSIS — Z3480 Encounter for supervision of other normal pregnancy, unspecified trimester: Secondary | ICD-10-CM

## 2016-10-31 DIAGNOSIS — Z3A16 16 weeks gestation of pregnancy: Secondary | ICD-10-CM

## 2016-10-31 NOTE — Progress Notes (Signed)
1 hour gtt today. Had an episode of passing out at work last week and was seen in ED for vasovagal syncope. See note from ED on 8/1. Encouraged adequate hydration and protein intake. Feeling well today and has no recurrence of symptoms since then. She is not feeling fetal movement yet. No LOF, VB, Ctx's. Anatomy scan nv.

## 2016-10-31 NOTE — Progress Notes (Signed)
1hr gtt today.  

## 2016-11-01 LAB — GLUCOSE, 1 HOUR GESTATIONAL: GESTATIONAL DIABETES SCREEN: 130 mg/dL (ref 65–139)

## 2016-11-13 ENCOUNTER — Ambulatory Visit
Admission: EM | Admit: 2016-11-13 | Discharge: 2016-11-13 | Disposition: A | Discharge status: Home / Self Care | Payer: Medicaid Other | Attending: Family Medicine | Admitting: Family Medicine

## 2016-11-13 ENCOUNTER — Encounter: Payer: Self-pay | Admitting: Emergency Medicine

## 2016-11-13 DIAGNOSIS — B3731 Acute candidiasis of vulva and vagina: Secondary | ICD-10-CM

## 2016-11-13 DIAGNOSIS — N760 Acute vaginitis: Secondary | ICD-10-CM

## 2016-11-13 DIAGNOSIS — Z87891 Personal history of nicotine dependence: Secondary | ICD-10-CM | POA: Diagnosis not present

## 2016-11-13 DIAGNOSIS — B373 Candidiasis of vulva and vagina: Secondary | ICD-10-CM | POA: Insufficient documentation

## 2016-11-13 DIAGNOSIS — Z3A18 18 weeks gestation of pregnancy: Secondary | ICD-10-CM | POA: Diagnosis not present

## 2016-11-13 DIAGNOSIS — O98812 Other maternal infectious and parasitic diseases complicating pregnancy, second trimester: Secondary | ICD-10-CM | POA: Diagnosis present

## 2016-11-13 DIAGNOSIS — B9689 Other specified bacterial agents as the cause of diseases classified elsewhere: Secondary | ICD-10-CM | POA: Insufficient documentation

## 2016-11-13 LAB — WET PREP, GENITAL
Sperm: NONE SEEN
Trich, Wet Prep: NONE SEEN

## 2016-11-13 MED ORDER — CLOTRIMAZOLE 1 % VA CREA: 1.0000 | TOPICAL_CREAM | Freq: Every day | VAGINAL | 0 refills | Status: AC

## 2016-11-13 MED ORDER — METRONIDAZOLE 500 MG PO TABS: 500.0000 mg | ORAL_TABLET | Freq: Two times a day (BID) | ORAL | 0 refills | Status: DC

## 2016-11-13 NOTE — ED Notes (Signed)
Fetal Heart Rate 147

## 2016-11-13 NOTE — ED Provider Notes (Signed)
[facility]    CSN: [id number] Arrival date & time: 11/13/16  1920     History   Chief Complaint Chief Complaint  Patient presents with  . Vaginal Itching    HPI Margaret Ortega is a 31 y.o. female.   31 yo female with a c/o vaginal itching for 2 days, associated with mild discharge. Denies any fevers, chills, pain, bleeding. States she's [redacted] weeks pregnant.    The history is provided by the patient.  Vaginal Itching  This is a new problem.    Past Medical History:  Diagnosis Date  . Obesity     Patient Active Problem List   Diagnosis Date Noted  . Supervision of other normal pregnancy, antepartum 09/10/2016  . History of cesarean section 09/10/2016  . History of successful vaginal birth after cesarean, currently pregnant 09/10/2016    Past Surgical History:  Procedure Laterality Date  . CESAREAN SECTION     breech presentation  . CESAREAN SECTION N/A 11/03/2014   Procedure: CESAREAN SECTION;  Surgeon: Elenora Fender Ward, MD;  Location: [facility] ORS;  Service: Obstetrics;  Laterality: N/A;    OB History    Gravida Para Term Preterm AB Living   4 2 2   1 2    SAB TAB Ectopic Multiple Live Births   1     0 2      Obstetric Comments   Patient scheduled for repeat cesarean section.  Primary was for breech presentation.  Patient has a cervical dilation of 4/80/-1 and wishes to have a trial of labor.  Consents signed and VBAC protocol initiated.         Home Medications    Prior to Admission medications   Medication Sig Start Date End Date Taking? Authorizing Provider  acetaminophen (TYLENOL) 500 MG tablet Take 1,000 mg by mouth every 6 (six) hours as needed.    [provider]  clotrimazole (GYNE-LOTRIMIN) 1 % vaginal cream Place 1 Applicatorful vaginally at bedtime. 11/13/16 11/20/16  Payton Mccallum, MD  metroNIDAZOLE (FLAGYL) 500 MG tablet Take 1 tablet (500 mg total) by mouth 2 (two) times daily. 11/13/16   Payton Mccallum, MD  Prenat w/o  A-FeCbGl-DSS-FA-DHA (CITRANATAL 90 DHA) 90-1 & 300 MG MISC Take 1 tablet by mouth daily. 09/19/16   Tresea Mall, CNM  Prenatal Vit-Fe Fumarate-FA (PRENATAL MULTIVITAMIN) TABS tablet Take 1 tablet by mouth daily at 12 noon. 11/05/14   [place] Bing, MD    Family History Family History  Problem Relation Age of Onset  . Cancer Unknown   . Cancer Paternal Grandmother        ovarian  . Ovarian cancer Paternal Grandmother     Social History Social History  Substance Use Topics  . Smoking status: Former Smoker    Quit date: 03/04/2014  . Smokeless tobacco: Never Used  . Alcohol use No     Allergies   Zyrtec [cetirizine]   Review of Systems Review of Systems   Physical Exam Triage Vital Signs ED Triage Vitals  Enc Vitals Group     BP 11/13/16 1947 115/70     Pulse Rate 11/13/16 1947 88     Resp 11/13/16 1947 16     Temp 11/13/16 1947 98.6 F (37 C)     Temp Source 11/13/16 1947 Oral     SpO2 11/13/16 1947 100 %     Weight 11/13/16 1944 232 lb (105.2 kg)     Height 11/13/16 1944 5\' 11"  (1.803 m)  Head Circumference --      Peak Flow --      Pain Score 11/13/16 1945 0     Pain Loc --      Pain Edu? --      Excl. in GC? --    No data found.   Updated Vital Signs BP 115/70 (BP Location: Left Arm)   Pulse 88   Temp 98.6 F (37 C) (Oral)   Resp 16   Ht 5\' 11"  (1.803 m)   Wt 232 lb (105.2 kg)   LMP 07/08/2016   SpO2 100%   Breastfeeding? No   BMI 32.36 kg/m   Visual Acuity Right Eye Distance:   Left Eye Distance:   Bilateral Distance:    Right Eye Near:   Left Eye Near:    Bilateral Near:     Physical Exam  Constitutional: She appears well-developed and well-nourished. No distress.  Genitourinary:    Pelvic exam was performed with patient supine. There is rash on the right labia. Cervix exhibits no discharge and no friability. No erythema, tenderness or bleeding in the vagina. No foreign body in the vagina. No signs of injury around the  vagina. Vaginal discharge found.  Genitourinary Comments: Erythematous rash to outer labia   Skin: She is not diaphoretic.  Nursing note and vitals reviewed.    UC Treatments / Results  Labs (all labs ordered are listed, but only abnormal results are displayed) Labs Reviewed  WET PREP, GENITAL - Abnormal; Notable for the following:       Result Value   Yeast Wet Prep HPF POC PRESENT (*)    Clue Cells Wet Prep HPF POC PRESENT (*)    WBC, Wet Prep HPF POC FEW (*)    All other components within normal limits    EKG  EKG Interpretation None       Radiology No results found.  Procedures Procedures (including critical care time)  Medications Ordered in UC Medications - No data to display   Initial Impression / Assessment and Plan / UC Course  I have reviewed the triage vital signs and the nursing notes.  Pertinent labs & imaging results that were available during my care of the patient were reviewed by me and considered in my medical decision making (see chart for details).       Final Clinical Impressions(s) / UC Diagnoses   Final diagnoses:  Bacterial vaginosis  Yeast vaginitis    New Prescriptions Discharge Medication List as of 11/13/2016  8:45 PM    START taking these medications   Details  clotrimazole (GYNE-LOTRIMIN) 1 % vaginal cream Place 1 Applicatorful vaginally at bedtime., Starting Wed 11/13/2016, Until Wed 11/20/2016, Normal    metroNIDAZOLE (FLAGYL) 500 MG tablet Take 1 tablet (500 mg total) by mouth 2 (two) times daily., Starting Wed 11/13/2016, Normal       1. Lab results and diagnosis reviewed with patient 2. rx as per orders above; reviewed possible side effects, interactions, risks and benefits  3. Follow-up prn if symptoms worsen or don't improve  Controlled Substance Prescriptions [place] Controlled Substance Registry consulted? Not Applicable   Payton Mccallum, MD 11/13/16 2101

## 2016-11-13 NOTE — ED Triage Notes (Signed)
Patient c/o vaginal itching that started yesterday.

## 2016-11-28 ENCOUNTER — Ambulatory Visit (INDEPENDENT_AMBULATORY_CARE_PROVIDER_SITE_OTHER): Payer: Medicaid Other | Admitting: Obstetrics and Gynecology

## 2016-11-28 ENCOUNTER — Other Ambulatory Visit: Payer: Self-pay | Admitting: Advanced Practice Midwife

## 2016-11-28 ENCOUNTER — Ambulatory Visit (INDEPENDENT_AMBULATORY_CARE_PROVIDER_SITE_OTHER): Payer: Medicaid Other

## 2016-11-28 VITALS — BP 124/74 | Wt 231.0 lb

## 2016-11-28 DIAGNOSIS — O34219 Maternal care for unspecified type scar from previous cesarean delivery: Secondary | ICD-10-CM

## 2016-11-28 DIAGNOSIS — Z98891 History of uterine scar from previous surgery: Secondary | ICD-10-CM

## 2016-11-28 DIAGNOSIS — Z362 Encounter for other antenatal screening follow-up: Secondary | ICD-10-CM

## 2016-11-28 DIAGNOSIS — Z3480 Encounter for supervision of other normal pregnancy, unspecified trimester: Secondary | ICD-10-CM

## 2016-11-28 DIAGNOSIS — Z3A20 20 weeks gestation of pregnancy: Secondary | ICD-10-CM

## 2016-11-28 NOTE — Progress Notes (Signed)
  Routine Prenatal Care Visit  Subjective  Margaret Ortega is a 31 y.o. [phone] at 1355w3d being seen today for ongoing prenatal care.  She is currently monitored for the following issues for this high-risk pregnancy and has Supervision of other normal pregnancy, antepartum; History of cesarean section; and History of successful vaginal birth after cesarean, currently pregnant on her problem list.  ----------------------------------------------------------------------------------- Patient reports no complaints.   Contractions: Not present. Vag. Bleeding: None.  Movement: Present. Denies leaking of fluid.  Anatomy scan complete today.  ----------------------------------------------------------------------------------- The following portions of the patient's history were reviewed and updated as appropriate: allergies, current medications, past family history, past medical history, past social history, past surgical history and problem list. Problem list updated.  Objective  Blood pressure 124/74, weight 231 lb (104.8 kg), last menstrual period 07/08/2016. Pregravid weight 222 lb (100.7 kg) Total Weight Gain 9 lb (4.082 kg) Urinalysis: Urine Protein: Negative Urine Glucose: Negative  Fetal Status: Fetal Heart Rate (bpm): present   Movement: Present     General:  Alert, oriented and cooperative. Patient is in no acute distress.  Skin: Skin is warm and dry. No rash noted.   Cardiovascular: Normal heart rate noted  Respiratory: Normal respiratory effort, no problems with respiration noted  Abdomen: Soft, gravid, appropriate for gestational age. Pain/Pressure: Absent     Pelvic:  Cervical exam deferred        Extremities: Normal range of motion.     Mental Status: Normal mood and affect. Normal behavior. Normal judgment and thought content.   Assessment   31 y.o. [id number] at 455w3d by  04/14/2017, by Last Menstrual Period presenting for routine prenatal visit  Plan   pregnancy 4 Problems (from  07/08/16 to present)    Problem Noted Resolved   History of cesarean section 09/10/2016 by Tresea MallGledhill, Jane, CNM No      Preterm labor symptoms and general obstetric precautions including but not limited to vaginal bleeding, contractions, leaking of fluid and fetal movement were reviewed in detail with the patient. Please refer to After Visit Summary for other counseling recommendations.   Return in about 4 weeks (around 12/26/2016) for Routine Prenatal Appointment.  Thomasene MohairStephen Female Minish, MD  11/28/2016 12:13 PM

## 2016-12-26 ENCOUNTER — Ambulatory Visit (INDEPENDENT_AMBULATORY_CARE_PROVIDER_SITE_OTHER): Payer: Medicaid Other | Admitting: Obstetrics & Gynecology

## 2016-12-26 VITALS — BP 120/80 | Wt 238.0 lb

## 2016-12-26 DIAGNOSIS — B9689 Other specified bacterial agents as the cause of diseases classified elsewhere: Secondary | ICD-10-CM

## 2016-12-26 DIAGNOSIS — Z3A24 24 weeks gestation of pregnancy: Secondary | ICD-10-CM

## 2016-12-26 DIAGNOSIS — Z98891 History of uterine scar from previous surgery: Secondary | ICD-10-CM

## 2016-12-26 DIAGNOSIS — O34219 Maternal care for unspecified type scar from previous cesarean delivery: Secondary | ICD-10-CM

## 2016-12-26 DIAGNOSIS — N760 Acute vaginitis: Secondary | ICD-10-CM | POA: Diagnosis not present

## 2016-12-26 DIAGNOSIS — Z3480 Encounter for supervision of other normal pregnancy, unspecified trimester: Secondary | ICD-10-CM

## 2016-12-26 MED ORDER — SECNIDAZOLE 2 G PO PACK: 2.0000 g | PACK | Freq: Once | ORAL | 1 refills | Status: AC

## 2016-12-26 NOTE — Progress Notes (Signed)
PNV, Labs nv VBAC planned; counseled. Microscopic wet-mount exam shows clue cells.    Plan tx for BV, discussed.    Rx- Solosec

## 2016-12-26 NOTE — Patient Instructions (Signed)

## 2017-01-23 ENCOUNTER — Ambulatory Visit (INDEPENDENT_AMBULATORY_CARE_PROVIDER_SITE_OTHER): Payer: Medicaid Other | Admitting: Certified Nurse Midwife

## 2017-01-23 ENCOUNTER — Encounter: Payer: Self-pay | Admitting: Certified Nurse Midwife

## 2017-01-23 ENCOUNTER — Other Ambulatory Visit: Payer: Medicaid Other

## 2017-01-23 VITALS — BP 102/68 | Wt 239.0 lb

## 2017-01-23 DIAGNOSIS — Z3480 Encounter for supervision of other normal pregnancy, unspecified trimester: Secondary | ICD-10-CM

## 2017-01-23 DIAGNOSIS — Z3A28 28 weeks gestation of pregnancy: Secondary | ICD-10-CM

## 2017-01-23 DIAGNOSIS — Z3A24 24 weeks gestation of pregnancy: Secondary | ICD-10-CM

## 2017-01-23 NOTE — Progress Notes (Signed)
ROB [id number] at [facility]28wk3d. Had CS for breech followed by VBAC.  Complains of right sacroiliac pain sometimes radiating to right hip. Doing some stretches. Advised can use Biofreeze or see a chiropractor for this pain HAs some vaginal pressure when she stands up after sitting down for a while. Baby active. Will be breast feeding. Contraception options discussed. Not ready for permanent BC.  28 week labs today. B POS blood type Discussed getting flu vaccine and TDAP at [place] [facility] info given ROB in 2 weeks. Margaret Ortega, CNM

## 2017-01-23 NOTE — Progress Notes (Signed)
ROB  1 GTT  PT has some vaginal pressure and sciatic nerve pain in leg  Declined Flu shot

## 2017-01-24 LAB — 28 WEEK RH+PANEL
BASOS: 0 %
Basophils Absolute: 0 10*3/uL (ref 0.0–0.2)
EOS (ABSOLUTE): 0.1 10*3/uL (ref 0.0–0.4)
EOS: 0 %
Gestational Diabetes Screen: 140 mg/dL — ABNORMAL HIGH (ref 65–139)
HEMOGLOBIN: 11.6 g/dL (ref 11.1–15.9)
HIV SCREEN 4TH GENERATION: NONREACTIVE
Hematocrit: 36 % (ref 34.0–46.6)
IMMATURE GRANS (ABS): 0 10*3/uL (ref 0.0–0.1)
Immature Granulocytes: 0 %
LYMPHS: 18 %
Lymphocytes Absolute: 2.1 10*3/uL (ref 0.7–3.1)
MCH: 26.2 pg — ABNORMAL LOW (ref 26.6–33.0)
MCHC: 32.2 g/dL (ref 31.5–35.7)
MCV: 81 fL (ref 79–97)
MONOCYTES: 4 %
Monocytes Absolute: 0.5 10*3/uL (ref 0.1–0.9)
NEUTROS ABS: 8.6 10*3/uL — AB (ref 1.4–7.0)
Neutrophils: 78 %
Platelets: 268 10*3/uL (ref 150–379)
RBC: 4.43 x10E6/uL (ref 3.77–5.28)
RDW: 15.1 % (ref 12.3–15.4)
RPR Ser Ql: NONREACTIVE
WBC: 11.3 10*3/uL — AB (ref 3.4–10.8)

## 2017-01-26 NOTE — Progress Notes (Signed)
Needs 3 hour GTT scheduled due to slight abnormal glucola screening

## 2017-01-27 ENCOUNTER — Telehealth: Payer: Self-pay | Admitting: Obstetrics & Gynecology

## 2017-01-27 NOTE — Telephone Encounter (Signed)
-----   Message from Cornelius MorasJessica Patterson, [place] sent at 01/27/2017  7:58 AM EST -----   ----- Message ----- From: Nadara MustardHarris, Robert P, MD Sent: 01/26/2017  12:38 PM To: Cornelius MorasJessica Patterson, CMA  Needs 3 hour GTT scheduled due to slight abnormal glucola screening

## 2017-01-27 NOTE — Telephone Encounter (Signed)
Pt is schedule 02/04/17

## 2017-02-04 ENCOUNTER — Other Ambulatory Visit: Payer: Medicaid Other

## 2017-02-06 ENCOUNTER — Encounter: Payer: Self-pay | Admitting: Maternal Newborn

## 2017-02-06 ENCOUNTER — Other Ambulatory Visit: Payer: Medicaid Other

## 2017-02-06 ENCOUNTER — Ambulatory Visit (INDEPENDENT_AMBULATORY_CARE_PROVIDER_SITE_OTHER): Payer: Medicaid Other | Admitting: Maternal Newborn

## 2017-02-06 VITALS — BP 106/60 | Wt 246.0 lb

## 2017-02-06 DIAGNOSIS — Z3480 Encounter for supervision of other normal pregnancy, unspecified trimester: Secondary | ICD-10-CM

## 2017-02-06 DIAGNOSIS — Z3A30 30 weeks gestation of pregnancy: Secondary | ICD-10-CM

## 2017-02-06 DIAGNOSIS — O99810 Abnormal glucose complicating pregnancy: Secondary | ICD-10-CM

## 2017-02-06 NOTE — Progress Notes (Signed)
Routine Prenatal Care Visit  Subjective  Margaret Ortega is a 31 y.o. [id number] at 7839w3d being seen today for ongoing prenatal care.  She is currently monitored for the following issues for this low-risk pregnancy and has Supervision of other normal pregnancy, antepartum; History of cesarean section; and History of successful vaginal birth after cesarean, currently pregnant on their problem list.  ----------------------------------------------------------------------------------- Patient reports sciatica.   Contractions: Not present. Vag. Bleeding: None.  Movement: Present. Denies leaking of fluid.  ----------------------------------------------------------------------------------- The following portions of the patient's history were reviewed and updated as appropriate: allergies, current medications, past family history, past medical history, past social history, past surgical history and problem list. Problem list updated.   Objective  Blood pressure 106/60, weight 246 lb (111.6 kg), last menstrual period 07/08/2016. Pregravid weight 222 lb (100.7 kg) Total Weight Gain 24 lb (10.9 kg) Urinalysis:      Fetal Status: Fetal Heart Rate (bpm): 138 Fundal Height: 31 cm Movement: Present     General:  Alert, oriented and cooperative. Patient is in no acute distress.  Skin: Skin is warm and dry. No rash noted.   Cardiovascular: Normal heart rate noted  Respiratory: Normal respiratory effort, no problems with respiration noted  Abdomen: Soft, gravid, appropriate for gestational age. Pain/Pressure: Absent     Pelvic:  Cervical exam deferred        Extremities: Normal range of motion.  Edema: None  Mental Status: Normal mood and affect. Normal behavior. Normal judgment and thought content.     Assessment   31 y.o. [id number] at 7239w3d by LMP on 04/14/2017, presenting for routine prenatal visit.  Plan   pregnancy 4 Problems (from 07/08/16 to present)    Problem Noted Resolved   Supervision  of other normal pregnancy, antepartum 09/10/2016 by Tresea MallGledhill, Jane, CNM No   Overview Signed 01/23/2017  1:35 PM by Farrel ConnersGutierrez, Colleen, CNM     Clinic [facility] Prenatal Labs  Dating LMP Blood type: B/Positive/-- (07/12 1118)   Genetic Screen 1 Screen: neg   AFP:     Quad:     NIPS: Antibody:Negative (07/12 1118)  Anatomic US WNL, posterior placenta, baby boy Rubella: 1.43 (07/12 1118) Varicella NI  GTT Early:    130           Third trimester:  RPR: Non Reactive (07/12 1118)   Flu vaccine  HBsAg: Negative (07/12 1118)   TDaP vaccine                                               Rhogam: HIV:   negative  Baby Food      Breast                                         GBS: (For PCN allergy, check sensitivities)  Contraception  Pap: ?2015  Circumcision    Pediatrician    Support Person             History of cesarean section 09/10/2016 by Tresea MallGledhill, Jane, CNM No   History of successful vaginal birth after cesarean, currently pregnant 09/10/2016 by Tresea MallGledhill, Jane, CNM No    3 hour GTT today. Desires to defer TDaP to next visit. Stretching and Biofreeze are helping a little with sciatic  nerve pain.  Preterm labor symptoms and general obstetric precautions including but not limited to vaginal bleeding, contractions, leaking of fluid and fetal movement were reviewed in detail with the patient.  Please refer to After Visit Summary for other counseling recommendations.   Return in about 2 weeks (around 02/20/2017) for ROB.  Marcelyn BruinsJacelyn Bushra Denman, CNM 02/06/2017  1:43 PM

## 2017-02-06 NOTE — Progress Notes (Signed)
C/o siatica pain rj

## 2017-02-06 NOTE — Addendum Note (Signed)
Addended by: Ulyess MortMASSEY, Daimian Sudberry P on: 02/06/2017 04:28 PM   Modules accepted: Orders

## 2017-02-06 NOTE — Patient Instructions (Signed)
Third Trimester of Pregnancy The third trimester is from week 28 through week 40 (months 7 through 9). The third trimester is a time when the unborn baby (fetus) is growing rapidly. At the end of the ninth month, the fetus is about 20 inches in length and weighs 6-10 pounds. Body changes during your third trimester Your body will continue to go through many changes during pregnancy. The changes vary from woman to woman. During the third trimester:  Your weight will continue to increase. You can expect to gain 25-35 pounds (11-16 kg) by the end of the pregnancy.  You may begin to get stretch marks on your hips, abdomen, and breasts.  You may urinate more often because the fetus is moving lower into your pelvis and pressing on your bladder.  You may develop or continue to have heartburn. This is caused by increased hormones that slow down muscles in the digestive tract.  You may develop or continue to have constipation because increased hormones slow digestion and cause the muscles that push waste through your intestines to relax.  You may develop hemorrhoids. These are swollen veins (varicose veins) in the rectum that can itch or be painful.  You may develop swollen, bulging veins (varicose veins) in your legs.  You may have increased body aches in the pelvis, back, or thighs. This is due to weight gain and increased hormones that are relaxing your joints.  You may have changes in your hair. These can include thickening of your hair, rapid growth, and changes in texture. Some women also have hair loss during or after pregnancy, or hair that feels dry or thin. Your hair will most likely return to normal after your baby is born.  Your breasts will continue to grow and they will continue to become tender. A yellow fluid (colostrum) may leak from your breasts. This is the first milk you are producing for your baby.  Your belly button may stick out.  You may notice more swelling in your hands,  face, or ankles.  You may have increased tingling or numbness in your hands, arms, and legs. The skin on your belly may also feel numb.  You may feel short of breath because of your expanding uterus.  You may have more problems sleeping. This can be caused by the size of your belly, increased need to urinate, and an increase in your body's metabolism.  You may notice the fetus "dropping," or moving lower in your abdomen (lightening).  You may have increased vaginal discharge.  You may notice your joints feel loose and you may have pain around your pelvic bone.  What to expect at prenatal visits You will have prenatal exams every 2 weeks until week 36. Then you will have weekly prenatal exams. During a routine prenatal visit:  You will be weighed to make sure you and the baby are growing normally.  Your blood pressure will be taken.  Your abdomen will be measured to track your baby's growth.  The fetal heartbeat will be listened to.  Any test results from the previous visit will be discussed.  You may have a cervical check near your due date to see if your cervix has softened or thinned (effaced).  You will be tested for Group B streptococcus. This happens between 35 and 37 weeks.  Your health care provider may ask you:  What your birth plan is.  How you are feeling.  If you are feeling the baby move.  If you have had   any abnormal symptoms, such as leaking fluid, bleeding, severe headaches, or abdominal cramping.  If you are using any tobacco products, including cigarettes, chewing tobacco, and electronic cigarettes.  If you have any questions.  Other tests or screenings that may be performed during your third trimester include:  Blood tests that check for low iron levels (anemia).  Fetal testing to check the health, activity level, and growth of the fetus. Testing is done if you have certain medical conditions or if there are problems during the  pregnancy.  Nonstress test (NST). This test checks the health of your baby to make sure there are no signs of problems, such as the baby not getting enough oxygen. During this test, a belt is placed around your belly. The baby is made to move, and its heart rate is monitored during movement.  What is false labor? False labor is a condition in which you feel small, irregular tightenings of the muscles in the womb (contractions) that usually go away with rest, changing position, or drinking water. These are called Braxton Hicks contractions. Contractions may last for hours, days, or even weeks before true labor sets in. If contractions come at regular intervals, become more frequent, increase in intensity, or become painful, you should see your health care provider. What are the signs of labor?  Abdominal cramps.  Regular contractions that start at 10 minutes apart and become stronger and more frequent with time.  Contractions that start on the top of the uterus and spread down to the lower abdomen and back.  Increased pelvic pressure and dull back pain.  A watery or bloody mucus discharge that comes from the vagina.  Leaking of amniotic fluid. This is also known as your "water breaking." It could be a slow trickle or a gush. Let your health care provider know if it has a color or strange odor. If you have any of these signs, call your health care provider right away, even if it is before your due date. Follow these instructions at home: Medicines  Follow your health care provider's instructions regarding medicine use. Specific medicines may be either safe or unsafe to take during pregnancy.  Take a prenatal vitamin that contains at least 600 micrograms (mcg) of folic acid.  If you develop constipation, try taking a stool softener if your health care provider approves. Eating and drinking  Eat a balanced diet that includes fresh fruits and vegetables, whole grains, good sources of protein  such as meat, eggs, or tofu, and low-fat dairy. Your health care provider will help you determine the amount of weight gain that is right for you.  Avoid raw meat and uncooked cheese. These carry germs that can cause birth defects in the baby.  If you have low calcium intake from food, talk to your health care provider about whether you should take a daily calcium supplement.  Eat four or five small meals rather than three large meals a day.  Limit foods that are high in fat and processed sugars, such as fried and sweet foods.  To prevent constipation: ? Drink enough fluid to keep your urine clear or pale yellow. ? Eat foods that are high in fiber, such as fresh fruits and vegetables, whole grains, and beans. Activity  Exercise only as directed by your health care provider. Most women can continue their usual exercise routine during pregnancy. Try to exercise for 30 minutes at least 5 days a week. Stop exercising if you experience uterine contractions.  Avoid heavy   lifting.  Do not exercise in extreme heat or humidity, or at high altitudes.  Wear low-heel, comfortable shoes.  Practice good posture.  You may continue to have sex unless your health care provider tells you otherwise. Relieving pain and discomfort  Take frequent breaks and rest with your legs elevated if you have leg cramps or low back pain.  Take warm sitz baths to soothe any pain or discomfort caused by hemorrhoids. Use hemorrhoid cream if your health care provider approves.  Wear a good support bra to prevent discomfort from breast tenderness.  If you develop varicose veins: ? Wear support pantyhose or compression stockings as told by your healthcare provider. ? Elevate your feet for 15 minutes, 3-4 times a day. Prenatal care  Write down your questions. Take them to your prenatal visits.  Keep all your prenatal visits as told by your health care provider. This is important. Safety  Wear your seat belt at  all times when driving.  Make a list of emergency phone numbers, including numbers for family, friends, the hospital, and police and fire departments. General instructions  Avoid cat litter boxes and soil used by cats. These carry germs that can cause birth defects in the baby. If you have a cat, ask someone to clean the litter box for you.  Do not travel far distances unless it is absolutely necessary and only with the approval of your health care provider.  Do not use hot tubs, steam rooms, or saunas.  Do not drink alcohol.  Do not use any products that contain nicotine or tobacco, such as cigarettes and e-cigarettes. If you need help quitting, ask your health care provider.  Do not use any medicinal herbs or unprescribed drugs. These chemicals affect the formation and growth of the baby.  Do not douche or use tampons or scented sanitary pads.  Do not cross your legs for long periods of time.  To prepare for the arrival of your baby: ? Take prenatal classes to understand, practice, and ask questions about labor and delivery. ? Make a trial run to the hospital. ? Visit the hospital and tour the maternity area. ? Arrange for maternity or paternity leave through employers. ? Arrange for family and friends to take care of pets while you are in the hospital. ? Purchase a rear-facing car seat and make sure you know how to install it in your car. ? Pack your hospital bag. ? Prepare the baby's nursery. Make sure to remove all pillows and stuffed animals from the baby's crib to prevent suffocation.  Visit your dentist if you have not gone during your pregnancy. Use a soft toothbrush to brush your teeth and be gentle when you floss. Contact a health care provider if:  You are unsure if you are in labor or if your water has broken.  You become dizzy.  You have mild pelvic cramps, pelvic pressure, or nagging pain in your abdominal area.  You have lower back pain.  You have persistent  nausea, vomiting, or diarrhea.  You have an unusual or bad smelling vaginal discharge.  You have pain when you urinate. Get help right away if:  Your water breaks before 37 weeks.  You have regular contractions less than 5 minutes apart before 37 weeks.  You have a fever.  You are leaking fluid from your vagina.  You have spotting or bleeding from your vagina.  You have severe abdominal pain or cramping.  You have rapid weight loss or weight gain.    You have shortness of breath with chest pain.  You notice sudden or extreme swelling of your face, hands, ankles, feet, or legs.  Your baby makes fewer than 10 movements in 2 hours.  You have severe headaches that do not go away when you take medicine.  You have vision changes. Summary  The third trimester is from week 28 through week 40, months 7 through 9. The third trimester is a time when the unborn baby (fetus) is growing rapidly.  During the third trimester, your discomfort may increase as you and your baby continue to gain weight. You may have abdominal, leg, and back pain, sleeping problems, and an increased need to urinate.  During the third trimester your breasts will keep growing and they will continue to become tender. A yellow fluid (colostrum) may leak from your breasts. This is the first milk you are producing for your baby.  False labor is a condition in which you feel small, irregular tightenings of the muscles in the womb (contractions) that eventually go away. These are called Braxton Hicks contractions. Contractions may last for hours, days, or even weeks before true labor sets in.  Signs of labor can include: abdominal cramps; regular contractions that start at 10 minutes apart and become stronger and more frequent with time; watery or bloody mucus discharge that comes from the vagina; increased pelvic pressure and dull back pain; and leaking of amniotic fluid. This information is not intended to replace advice  given to you by your health care provider. Make sure you discuss any questions you have with your health care provider. Document Released: 03/05/2001 Document Revised: 08/17/2015 Document Reviewed: 05/12/2012 Elsevier Interactive Patient Education  2017 [employer]  

## 2017-02-07 LAB — GESTATIONAL GLUCOSE TOLERANCE
GLUCOSE 1 HOUR GTT: 160 mg/dL (ref 65–179)
GLUCOSE 2 HOUR GTT: 123 mg/dL (ref 65–154)
GLUCOSE 3 HOUR GTT: 66 mg/dL (ref 65–139)
Glucose, Fasting: 78 mg/dL (ref 65–94)

## 2017-02-20 ENCOUNTER — Ambulatory Visit (INDEPENDENT_AMBULATORY_CARE_PROVIDER_SITE_OTHER): Payer: Medicaid Other | Admitting: Maternal Newborn

## 2017-02-20 ENCOUNTER — Encounter: Payer: Self-pay | Admitting: Maternal Newborn

## 2017-02-20 VITALS — BP 110/60 | Wt 248.0 lb

## 2017-02-20 DIAGNOSIS — Z3480 Encounter for supervision of other normal pregnancy, unspecified trimester: Secondary | ICD-10-CM

## 2017-02-20 DIAGNOSIS — Z23 Encounter for immunization: Secondary | ICD-10-CM | POA: Diagnosis not present

## 2017-02-20 DIAGNOSIS — Z3A32 32 weeks gestation of pregnancy: Secondary | ICD-10-CM

## 2017-02-20 MED ORDER — TETANUS-DIPHTH-ACELL PERTUSSIS 5-2.5-18.5 LF-MCG/0.5 IM SUSP
0.5000 mL | Freq: Once | INTRAMUSCULAR | Status: AC
Start: 2017-02-20 — End: 2017-02-20
  Administered 2017-02-20: 0.5 mL via INTRAMUSCULAR

## 2017-02-20 NOTE — Progress Notes (Signed)
Routine Prenatal Care Visit  Subjective  Margaret Ortega is a 31 y.o. [id number] at 2367w3d being seen today for ongoing prenatal care.  She is currently monitored for the following issues for this low-risk pregnancy and has Supervision of other normal pregnancy, antepartum; History of cesarean section; and History of successful vaginal birth after cesarean, currently pregnant on their problem list.  ----------------------------------------------------------------------------------- Patient reports heartburn and sciatica.   Contractions: Not present. Vag. Bleeding: None.  Movement: Present. Denies leaking of fluid.  ----------------------------------------------------------------------------------- The following portions of the patient's history were reviewed and updated as appropriate: allergies, current medications, past family history, past medical history, past social history, past surgical history and problem list. Problem list updated.   Objective  Blood pressure 110/60, weight 248 lb (112.5 kg), last menstrual period 07/08/2016. Pregravid weight 222 lb (100.7 kg) Total Weight Gain 26 lb (11.8 kg) Urinalysis: Urine Protein: Negative Urine Glucose: Negative  Fetal Status: Fetal Heart Rate (bpm): 141 Fundal Height: 33 cm Movement: Present     General:  Alert, oriented and cooperative. Patient is in no acute distress.  Skin: Skin is warm and dry. No rash noted.   Cardiovascular: Normal heart rate noted  Respiratory: Normal respiratory effort, no problems with respiration noted  Abdomen: Soft, gravid, appropriate for gestational age. Pain/Pressure: Present     Pelvic:  Cervical exam deferred        Extremities: Normal range of motion.  Edema: None  Mental Status: Normal mood and affect. Normal behavior. Normal judgment and thought content.     Assessment   31 y.o. [name] at 7167w3d, EDD 04/14/2017 by Last Menstrual Period presenting for routine prenatal visit.  Plan   pregnancy 4  Problems (from 07/08/16 to present)    Problem Noted Resolved   Supervision of other normal pregnancy, antepartum 09/10/2016 by Tresea MallGledhill, Jane, CNM No   Overview Signed 01/23/2017  1:35 PM by Farrel ConnersGutierrez, Colleen, CNM     [facility]inic [facility] Prenatal Labs  Dating LMP Blood type: B/Positive/-- (07/12 1118)   Genetic Screen 1 Screen: neg   AFP:     Quad:     NIPS: Antibody:Negative (07/12 1118)  Anatomic US WNL, posterior placenta, baby boy Rubella: 1.43 (07/12 1118) Varicella NI  GTT Early:    130           Third trimester:  RPR: Non Reactive (07/12 1118)   Flu vaccine  HBsAg: Negative (07/12 1118)   TDaP vaccine                                               Rhogam: HIV:   negative  Baby Food      Breast                                         GBS: (For PCN allergy, check sensitivities)  Contraception  Pap: ?2015  Circumcision    Pediatrician    Support Person             History of cesarean section 09/10/2016 by Tresea MallGledhill, Jane, CNM No   History of successful vaginal birth after cesarean, currently pregnant 09/10/2016 by Tresea MallGledhill, Jane, CNM No    She has been taking Tums for heartburn with little relief. Advised to try Zantac  and if it doesn't work, will consider Rx for PPI. Sciatica stable with some relief from stretching and using an exercise ball.  Preterm labor symptoms and general obstetric precautions including but not limited to vaginal bleeding, contractions, leaking of fluid and fetal movement were reviewed in detail with the patient.  Return in about 2 weeks (around 03/06/2017) for ROB.  Marcelyn BruinsJacelyn Devita Nies, CNM 02/20/2017  11:13 AM

## 2017-03-06 ENCOUNTER — Ambulatory Visit (INDEPENDENT_AMBULATORY_CARE_PROVIDER_SITE_OTHER): Payer: Medicaid Other | Admitting: Obstetrics and Gynecology

## 2017-03-06 VITALS — BP 110/60 | Wt 248.0 lb

## 2017-03-06 DIAGNOSIS — Z98891 History of uterine scar from previous surgery: Secondary | ICD-10-CM

## 2017-03-06 DIAGNOSIS — Z3480 Encounter for supervision of other normal pregnancy, unspecified trimester: Secondary | ICD-10-CM

## 2017-03-06 DIAGNOSIS — O34219 Maternal care for unspecified type scar from previous cesarean delivery: Secondary | ICD-10-CM

## 2017-03-06 DIAGNOSIS — Z3A34 34 weeks gestation of pregnancy: Secondary | ICD-10-CM

## 2017-03-06 NOTE — Progress Notes (Signed)
Routine Prenatal Care Visit  Subjective  Margaret Ortega is a 31 y.o. [id number] at 2471w6d being seen today for ongoing prenatal care.  She is currently monitored for the following issues for this high-risk pregnancy and has Supervision of other normal pregnancy, antepartum; History of cesarean section; and History of successful vaginal birth after cesarean, currently pregnant on their problem list.  ----------------------------------------------------------------------------------- Patient reports pain in lower back and sides, also pelvic pressure. Contractions: Not present. Vag. Bleeding: None.  Movement: Present. Denies leaking of fluid.  ----------------------------------------------------------------------------------- The following portions of the patient's history were reviewed and updated as appropriate: allergies, current medications, past family history, past medical history, past social history, past surgical history and problem list. Problem list updated.   Objective  Blood pressure 110/60, weight 248 lb (112.5 kg), last menstrual period 07/08/2016. Pregravid weight 222 lb (100.7 kg) Total Weight Gain 26 lb (11.8 kg) Urinalysis: Urine Protein: Negative Urine Glucose: Negative  Fetal Status: Fetal Heart Rate (bpm): 140 Fundal Height: 34 cm Movement: Present     General:  Alert, oriented and cooperative. Patient is in no acute distress.  Skin: Skin is warm and dry. No rash noted.   Cardiovascular: Normal heart rate noted  Respiratory: Normal respiratory effort, no problems with respiration noted  Abdomen: Soft, gravid, appropriate for gestational age. Pain/Pressure: Present     Pelvic:  Cervical exam deferred        Extremities: Normal range of motion.  Edema: None  ental Status: Normal mood and affect. Normal behavior. Normal judgment and thought content.     Assessment   31 y.o. [id number] at 8071w6d by  04/14/2017, by Last Menstrual Period presenting for routine prenatal  visit  Plan   pregnancy 4 Problems (from 07/08/16 to present)    Problem Noted Resolved   Supervision of other normal pregnancy, antepartum 09/10/2016 by Tresea MallGledhill, Jane, CNM No   Overview Signed 01/23/2017  1:35 PM by Farrel ConnersGutierrez, Colleen, CNM     [facility]inic [facility] Prenatal Labs  Dating LMP Blood type: B/Positive/-- (07/12 1118)   Genetic Screen 1 Screen: neg   AFP:     Quad:     NIPS: Antibody:Negative (07/12 1118)  Anatomic US WNL, posterior placenta, baby boy Rubella: 1.43 (07/12 1118) Varicella NI  GTT Early:    130           Third trimester:  RPR: Non Reactive (07/12 1118)   Flu vaccine  HBsAg: Negative (07/12 1118)   TDaP vaccine                                               Rhogam: HIV:   negative  Baby Food      Breast                                         GBS: (For PCN allergy, check sensitivities)  Contraception  Pap: ?2015  Circumcision    Pediatrician    Support Person             History of cesarean section 09/10/2016 by Tresea MallGledhill, Jane, CNM No   History of successful vaginal birth after cesarean, currently pregnant 09/10/2016 by Tresea MallGledhill, Jane, CNM No       Preterm labor symptoms and general obstetric  precautions including but not limited to vaginal bleeding, contractions, leaking of fluid and fetal movement were reviewed in detail with the patient. Please refer to After Visit Summary for other counseling recommendations.   Return in about 1 week (around 03/13/2017) for rob.

## 2017-03-14 ENCOUNTER — Encounter: Payer: Self-pay | Admitting: Obstetrics and Gynecology

## 2017-03-14 ENCOUNTER — Ambulatory Visit (INDEPENDENT_AMBULATORY_CARE_PROVIDER_SITE_OTHER): Payer: Medicaid Other | Admitting: Obstetrics and Gynecology

## 2017-03-14 VITALS — BP 118/70 | Wt 250.0 lb

## 2017-03-14 DIAGNOSIS — Z98891 History of uterine scar from previous surgery: Secondary | ICD-10-CM

## 2017-03-14 DIAGNOSIS — Z3480 Encounter for supervision of other normal pregnancy, unspecified trimester: Secondary | ICD-10-CM

## 2017-03-14 DIAGNOSIS — Z3A35 35 weeks gestation of pregnancy: Secondary | ICD-10-CM

## 2017-03-14 DIAGNOSIS — O34219 Maternal care for unspecified type scar from previous cesarean delivery: Secondary | ICD-10-CM

## 2017-03-14 NOTE — Progress Notes (Signed)
Routine Prenatal Care Visit  Subjective  Margaret Ortega is a 31 y.o. [id number] at 4858w4d being seen today for ongoing prenatal care.  She is currently monitored for the following issues for this high-risk pregnancy and has Supervision of other normal pregnancy, antepartum; History of cesarean section; and History of successful vaginal birth after cesarean, currently pregnant on their problem list.  ----------------------------------------------------------------------------------- Patient reports dizzyness at times. Consistent with caval compression.  Advised positioning, increased fluid intake. Contractions: Not present. Vag. Bleeding: None.  Movement: Present. Denies leaking of fluid.  ----------------------------------------------------------------------------------- The following portions of the patient's history were reviewed and updated as appropriate: allergies, current medications, past family history, past medical history, past social history, past surgical history and problem list. Problem list updated.   Objective  Blood pressure 118/70, weight 250 lb (113.4 kg), last menstrual period 07/08/2016. Pregravid weight 222 lb (100.7 kg) Total Weight Gain 28 lb (12.7 kg) Urinalysis:      Fetal Status: Fetal Heart Rate (bpm): 145 Fundal Height: 36 cm Movement: Present     General:  Alert, oriented and cooperative. Patient is in no acute distress.  Skin: Skin is warm and dry. No rash noted.   Cardiovascular: Normal heart rate noted  Respiratory: Normal respiratory effort, no problems with respiration noted  Abdomen: Soft, gravid, appropriate for gestational age. Pain/Pressure: Present     Pelvic:  Cervical exam deferred        Extremities: Normal range of motion.  Edema: None  Mental Status: Normal mood and affect. Normal behavior. Normal judgment and thought content.   Assessment   31 y.o. [id number] at 5758w4d by  04/14/2017, by Last Menstrual Period presenting for routine prenatal  visit  Plan   pregnancy 4 Problems (from 07/08/16 to present)    Problem Noted Resolved   Supervision of other normal pregnancy, antepartum 09/10/2016 by Tresea MallGledhill, Jane, CNM No   Overview Signed 01/23/2017  1:35 PM by Farrel ConnersGutierrez, Colleen, CNM     Clinic [facility] Prenatal Labs  Dating LMP Blood type: B/Positive/-- (07/12 1118)   Genetic Screen 1 Screen: neg   AFP:     Quad:     NIPS: Antibody:Negative (07/12 1118)  Anatomic US WNL, posterior placenta, baby boy Rubella: 1.43 (07/12 1118) Varicella NI  GTT Early:    130           Third trimester:  RPR: Non Reactive (07/12 1118)   Flu vaccine  HBsAg: Negative (07/12 1118)   TDaP vaccine                                               Rhogam: HIV:   negative  Baby Food      Breast                                         GBS: (For PCN allergy, check sensitivities)  Contraception  Pap: ?2015  Circumcision    Pediatrician    Support Person         History of cesarean section 09/10/2016 by Tresea MallGledhill, Jane, CNM No   History of successful vaginal birth after cesarean, currently pregnant 09/10/2016 by Tresea MallGledhill, Jane, CNM No     Preterm labor symptoms and general obstetric precautions including but not limited to vaginal bleeding,  contractions, leaking of fluid and fetal movement were reviewed in detail with the patient. Please refer to After Visit Summary for other counseling recommendations.   Return in about 1 week (around 03/21/2017) for Routine Prenatal Appointment.  Thomasene MohairStephen Ebon Ketchum, MD  03/14/2017 11:52 AM

## 2017-03-21 ENCOUNTER — Ambulatory Visit (INDEPENDENT_AMBULATORY_CARE_PROVIDER_SITE_OTHER): Payer: Medicaid Other | Admitting: Advanced Practice Midwife

## 2017-03-21 ENCOUNTER — Encounter: Payer: Self-pay | Admitting: Advanced Practice Midwife

## 2017-03-21 VITALS — BP 100/60 | Wt 251.0 lb

## 2017-03-21 DIAGNOSIS — Z3A36 36 weeks gestation of pregnancy: Secondary | ICD-10-CM

## 2017-03-21 DIAGNOSIS — Z3685 Encounter for antenatal screening for Streptococcus B: Secondary | ICD-10-CM

## 2017-03-21 DIAGNOSIS — Z113 Encounter for screening for infections with a predominantly sexual mode of transmission: Secondary | ICD-10-CM

## 2017-03-21 NOTE — Progress Notes (Signed)
Routine Prenatal Care Visit  Subjective  Margaret Ortega is a 31 y.o. [id number] at 4526w4d being seen today for ongoing prenatal care.  She is currently monitored for the following issues for this low-risk pregnancy and has Supervision of other normal pregnancy, antepartum; History of cesarean section; and History of successful vaginal birth after cesarean, currently pregnant on their problem list.  ----------------------------------------------------------------------------------- Patient reports discomforts of third trimester, pelvic pain and low back pain.   Contractions: Not present. Vag. Bleeding: None.  Movement: Present. Denies leaking of fluid.  ----------------------------------------------------------------------------------- The following portions of the patient's history were reviewed and updated as appropriate: allergies, current medications, past family history, past medical history, past social history, past surgical history and problem list. Problem list updated.   Objective  Blood pressure 100/60, weight 251 lb (113.9 kg), last menstrual period 07/08/2016. Pregravid weight 222 lb (100.7 kg) Total Weight Gain 29 lb (13.2 kg) Urinalysis: Urine Protein: Negative Urine Glucose: Negative  Fetal Status: Fetal Heart Rate (bpm): 137 Fundal Height: 38 cm Movement: Present     General:  Alert, oriented and cooperative. Patient is in no acute distress.  Skin: Skin is warm and dry. No rash noted.   Cardiovascular: Normal heart rate noted  Respiratory: Normal respiratory effort, no problems with respiration noted  Abdomen: Soft, gravid, appropriate for gestational age. Pain/Pressure: Present     Pelvic:  Cervical exam performed Dilation: Fingertip    speculum exam for collection of GBS swab visually dilated a fingertip  Extremities: Normal range of motion.  Edema: None  Mental Status: Normal mood and affect. Normal behavior. Normal judgment and thought content.   Assessment   31 y.o.  [id number] at 7726w4d by  04/14/2017, by Last Menstrual Period presenting for routine prenatal visit  Plan   pregnancy 4 Problems (from 07/08/16 to present)    Problem Noted Resolved   Supervision of other normal pregnancy, antepartum 09/10/2016 by Tresea MallGledhill, Iosefa Weintraub, CNM No   Overview Signed 01/23/2017  1:35 PM by Farrel ConnersGutierrez, Colleen, CNM     Clinic [facility] Prenatal Labs  Dating LMP Blood type: B/Positive/-- (07/12 1118)   Genetic Screen 1 Screen: neg   AFP:     Quad:     NIPS: Antibody:Negative (07/12 1118)  Anatomic US WNL, posterior placenta, baby boy Rubella: 1.43 (07/12 1118) Varicella NI  GTT Early:    130           Third trimester:  RPR: Non Reactive (07/12 1118)   Flu vaccine  HBsAg: Negative (07/12 1118)   TDaP vaccine                                               Rhogam: HIV:   negative  Baby Food      Breast                                         GBS: (For PCN allergy, check sensitivities)  Contraception  Pap: ?2015  Circumcision    Pediatrician    Support Person             History of cesarean section 09/10/2016 by Tresea MallGledhill, Keanen Dohse, CNM No   History of successful vaginal birth after cesarean, currently pregnant 09/10/2016 by Tresea MallGledhill, Yu Peggs, CNM No  Preterm labor symptoms and general obstetric precautions including but not limited to vaginal bleeding, contractions, leaking of fluid and fetal movement were reviewed in detail with the patient.  GBS/aptima today Please refer to After Visit Summary for other counseling recommendations.   Return in about 1 week (around 03/28/2017) for rob.  Tresea MallJane Aaban Griep, CNM  03/21/2017 10:59 AM

## 2017-03-21 NOTE — Patient Instructions (Signed)
Vaginal Delivery Vaginal delivery means that you will give birth by pushing your baby out of your birth canal (vagina). A team of health care providers will help you before, during, and after vaginal delivery. Birth experiences are unique for every woman and every pregnancy, and birth experiences vary depending on where you choose to give birth. What should I do to prepare for my baby's birth? Before your baby is born, it is important to talk with your health care provider about:  Your labor and delivery preferences. These may include: ? Medicines that you may be given. ? How you will manage your pain. This might include non-medical pain relief techniques or injectable pain relief such as epidural analgesia. ? How you and your baby will be monitored during labor and delivery. ? Who may be in the labor and delivery room with you. ? Your feelings about surgical delivery of your baby (cesarean delivery, or C-section) if this becomes necessary. ? Your feelings about receiving donated blood through an IV tube (blood transfusion) if this becomes necessary.  Whether you are able: ? To take pictures or videos of the birth. ? To eat during labor and delivery. ? To move around, walk, or change positions during labor and delivery.  What to expect after your baby is born, such as: ? Whether delayed umbilical cord clamping and cutting is offered. ? Who will care for your baby right after birth. ? Medicines or tests that may be recommended for your baby. ? Whether breastfeeding is supported in your hospital or birth center. ? How long you will be in the hospital or birth center.  How any medical conditions you have may affect your baby or your labor and delivery experience.  To prepare for your baby's birth, you should also:  Attend all of your health care visits before delivery (prenatal visits) as recommended by your health care provider. This is important.  Prepare your home for your baby's  arrival. Make sure that you have: ? Diapers. ? Baby clothing. ? Feeding equipment. ? Safe sleeping arrangements for you and your baby.  Install a car seat in your vehicle. Have your car seat checked by a certified car seat installer to make sure that it is installed safely.  Think about who will help you with your new baby at home for at least the first several weeks after delivery.  What can I expect when I arrive at the birth center or hospital? Once you are in labor and have been admitted into the hospital or birth center, your health care provider may:  Review your pregnancy history and any concerns you have.  Insert an IV tube into one of your veins. This is used to give you fluids and medicines.  Check your blood pressure, pulse, temperature, and heart rate (vital signs).  Check whether your bag of water (amniotic sac) has broken (ruptured).  Talk with you about your birth plan and discuss pain control options.  Monitoring Your health care provider may monitor your contractions (uterine monitoring) and your baby's heart rate (fetal monitoring). You may need to be monitored:  Often, but not continuously (intermittently).  All the time or for long periods at a time (continuously). Continuous monitoring may be needed if: ? You are taking certain medicines, such as medicine to relieve pain or make your contractions stronger. ? You have pregnancy or labor complications.  Monitoring may be done by:  Placing a special stethoscope or a handheld monitoring device on your abdomen to   check your baby's heartbeat, and feeling your abdomen for contractions. This method of monitoring does not continuously record your baby's heartbeat or your contractions.  Placing monitors on your abdomen (external monitors) to record your baby's heartbeat and the frequency and length of contractions. You may not have to wear external monitors all the time.  Placing monitors inside of your uterus  (internal monitors) to record your baby's heartbeat and the frequency, length, and strength of your contractions. ? Your health care provider may use internal monitors if he or she needs more information about the strength of your contractions or your baby's heart rate. ? Internal monitors are put in place by passing a thin, flexible wire through your vagina and into your uterus. Depending on the type of monitor, it may remain in your uterus or on your baby's head until birth. ? Your health care provider will discuss the benefits and risks of internal monitoring with you and will ask for your permission before inserting the monitors.  Telemetry. This is a type of continuous monitoring that can be done with external or internal monitors. Instead of having to stay in bed, you are able to move around during telemetry. Ask your health care provider if telemetry is an option for you.  Physical exam Your health care provider may perform a physical exam. This may include:  Checking whether your baby is positioned: ? With the head toward your vagina (head-down). This is most common. ? With the head toward the top of your uterus (head-up or breech). If your baby is in a breech position, your health care provider may try to turn your baby to a head-down position so you can deliver vaginally. If it does not seem that your baby can be born vaginally, your provider may recommend surgery to deliver your baby. In rare cases, you may be able to deliver vaginally if your baby is head-up (breech delivery). ? Lying sideways (transverse). Babies that are lying sideways cannot be delivered vaginally.  Checking your cervix to determine: ? Whether it is thinning out (effacing). ? Whether it is opening up (dilating). ? How low your baby has moved into your birth canal.  What are the three stages of labor and delivery?  Normal labor and delivery is divided into the following three stages: Stage 1  Stage 1 is the  longest stage of labor, and it can last for hours or days. Stage 1 includes: ? Early labor. This is when contractions may be irregular, or regular and mild. Generally, early labor contractions are more than 10 minutes apart. ? Active labor. This is when contractions get longer, more regular, more frequent, and more intense. ? The transition phase. This is when contractions happen very close together, are very intense, and may last longer than during any other part of labor.  Contractions generally feel mild, infrequent, and irregular at first. They get stronger, more frequent (about every 2-3 minutes), and more regular as you progress from early labor through active labor and transition.  Many women progress through stage 1 naturally, but you may need help to continue making progress. If this happens, your health care provider may talk with you about: ? Rupturing your amniotic sac if it has not ruptured yet. ? Giving you medicine to help make your contractions stronger and more frequent.  Stage 1 ends when your cervix is completely dilated to 4 inches (10 cm) and completely effaced. This happens at the end of the transition phase. Stage 2  Once   your cervix is completely effaced and dilated to 4 inches (10 cm), you may start to feel an urge to push. It is common for the body to naturally take a rest before feeling the urge to push, especially if you received an epidural or certain other pain medicines. This rest period may last for up to 1-2 hours, depending on your unique labor experience.  During stage 2, contractions are generally less painful, because pushing helps relieve contraction pain. Instead of contraction pain, you may feel stretching and burning pain, especially when the widest part of your baby's head passes through the vaginal opening (crowning).  Your health care provider will closely monitor your pushing progress and your baby's progress through the vagina during stage 2.  Your  health care provider may massage the area of skin between your vaginal opening and anus (perineum) or apply warm compresses to your perineum. This helps it stretch as the baby's head starts to crown, which can help prevent perineal tearing. ? In some cases, an incision may be made in your perineum (episiotomy) to allow the baby to pass through the vaginal opening. An episiotomy helps to make the opening of the vagina larger to allow more room for the baby to fit through.  It is very important to breathe and focus so your health care provider can control the delivery of your baby's head. Your health care provider may have you decrease the intensity of your pushing, to help prevent perineal tearing.  After delivery of your baby's head, the shoulders and the rest of the body generally deliver very quickly and without difficulty.  Once your baby is delivered, the umbilical cord may be cut right away, or this may be delayed for 1-2 minutes, depending on your baby's health. This may vary among health care providers, hospitals, and birth centers.  If you and your baby are healthy enough, your baby may be placed on your chest or abdomen to help maintain the baby's temperature and to help you bond with each other. Some mothers and babies start breastfeeding at this time. Your health care team will dry your baby and help keep your baby warm during this time.  Your baby may need immediate care if he or she: ? Showed signs of distress during labor. ? Has a medical condition. ? Was born too early (prematurely). ? Had a bowel movement before birth (meconium). ? Shows signs of difficulty transitioning from being inside the uterus to being outside of the uterus. If you are planning to breastfeed, your health care team will help you begin a feeding. Stage 3  The third stage of labor starts immediately after the birth of your baby and ends after you deliver the placenta. The placenta is an organ that develops  during pregnancy to provide oxygen and nutrients to your baby in the womb.  Delivering the placenta may require some pushing, and you may have mild contractions. Breastfeeding can stimulate contractions to help you deliver the placenta.  After the placenta is delivered, your uterus should tighten (contract) and become firm. This helps to stop bleeding in your uterus. To help your uterus contract and to control bleeding, your health care provider may: ? Give you medicine by injection, through an IV tube, by mouth, or through your rectum (rectally). ? Massage your abdomen or perform a vaginal exam to remove any blood clots that are left in your uterus. ? Empty your bladder by placing a thin, flexible tube (catheter) into your bladder. ? Encourage   you to breastfeed your baby. After labor is over, you and your baby will be monitored closely to ensure that you are both healthy until you are ready to go home. Your health care team will teach you how to care for yourself and your baby. This information is not intended to replace advice given to you by your health care provider. Make sure you discuss any questions you have with your health care provider. Document Released: 12/19/2007 Document Revised: 09/29/2015 Document Reviewed: 03/26/2015 Elsevier Interactive Patient Education  2018 [employer]  

## 2017-03-23 LAB — CHLAMYDIA/GONOCOCCUS/TRICHOMONAS, NAA
Chlamydia by NAA: NEGATIVE
Gonococcus by NAA: NEGATIVE
Trich vag by NAA: NEGATIVE

## 2017-03-23 LAB — STREP GP B NAA: STREP GROUP B AG: NEGATIVE

## 2017-03-28 ENCOUNTER — Encounter: Payer: Self-pay | Admitting: Advanced Practice Midwife

## 2017-03-28 ENCOUNTER — Ambulatory Visit (INDEPENDENT_AMBULATORY_CARE_PROVIDER_SITE_OTHER): Payer: Medicaid Other | Admitting: Advanced Practice Midwife

## 2017-03-28 VITALS — BP 114/70 | Wt 254.0 lb

## 2017-03-28 DIAGNOSIS — Z3A37 37 weeks gestation of pregnancy: Secondary | ICD-10-CM

## 2017-03-28 NOTE — Progress Notes (Signed)
Routine Prenatal Care Visit  Subjective  Margaret Ortega is a 32 y.o. [id number] at 3225w4d being seen today for ongoing prenatal care.  She is currently monitored for the following issues for this low-risk pregnancy and has Supervision of other normal pregnancy, antepartum; History of cesarean section; and History of successful vaginal birth after cesarean, currently pregnant on their problem list.  ----------------------------------------------------------------------------------- Patient reports backache.  Comfort measures discussed. Contractions: Not present. Vag. Bleeding: None.  Movement: Present. Denies leaking of fluid.  ----------------------------------------------------------------------------------- The following portions of the patient's history were reviewed and updated as appropriate: allergies, current medications, past family history, past medical history, past social history, past surgical history and problem list. Problem list updated.   Objective  Blood pressure 114/70, weight 254 lb (115.2 kg), last menstrual period 07/08/2016. Pregravid weight 222 lb (100.7 kg) Total Weight Gain 32 lb (14.5 kg) Urinalysis: Urine Protein: Negative Urine Glucose: Negative  Fetal Status: Fetal Heart Rate (bpm): 144 Fundal Height: 40 cm Movement: Present     General:  Alert, oriented and cooperative. Patient is in no acute distress.  Skin: Skin is warm and dry. No rash noted.   Cardiovascular: Normal heart rate noted  Respiratory: Normal respiratory effort, no problems with respiration noted  Abdomen: Soft, gravid, appropriate for gestational age. Pain/Pressure: Present     Pelvic:  Cervical exam deferred        Extremities: Normal range of motion.     Mental Status: Normal mood and affect. Normal behavior. Normal judgment and thought content.   Assessment   31 y.o. [id number] at 9325w4d by  04/14/2017, by Last Menstrual Period presenting for routine prenatal visit  Plan   pregnancy 4  Problems (from 07/08/16 to present)    Problem Noted Resolved   Supervision of other normal pregnancy, antepartum 09/10/2016 by Tresea MallGledhill, Laysa Kimmey, CNM No   Overview Signed 01/23/2017  1:35 PM by Farrel ConnersGutierrez, Colleen, CNM     Clinic [facility] Prenatal Labs  Dating LMP Blood type: B/Positive/-- (07/12 1118)   Genetic Screen 1 Screen: neg   AFP:     Quad:     NIPS: Antibody:Negative (07/12 1118)  Anatomic US WNL, posterior placenta, baby boy Rubella: 1.43 (07/12 1118) Varicella NI  GTT Early:    130           Third trimester:  RPR: Non Reactive (07/12 1118)   Flu vaccine  HBsAg: Negative (07/12 1118)   TDaP vaccine                                               Rhogam: HIV:   negative  Baby Food      Breast                                         GBS: (For PCN allergy, check sensitivities)  Contraception  Pap: ?2015  Circumcision    Pediatrician    Support Person             History of cesarean section 09/10/2016 by Tresea MallGledhill, Melita Villalona, CNM No   History of successful vaginal birth after cesarean, currently pregnant 09/10/2016 by Tresea MallGledhill, Jaielle Dlouhy, CNM No       Term labor symptoms and general obstetric precautions including but not limited to vaginal bleeding,  contractions, leaking of fluid and fetal movement were reviewed in detail with the patient.   Return in about 1 week (around 04/04/2017) for rob.  Tresea Mall, CNM  03/28/2017 10:37 AM

## 2017-03-28 NOTE — Progress Notes (Signed)
No vb. No lof.  

## 2017-04-04 ENCOUNTER — Ambulatory Visit (INDEPENDENT_AMBULATORY_CARE_PROVIDER_SITE_OTHER): Payer: Medicaid Other | Admitting: Obstetrics and Gynecology

## 2017-04-04 ENCOUNTER — Encounter: Payer: Self-pay | Admitting: Obstetrics and Gynecology

## 2017-04-04 VITALS — BP 100/50 | Wt 254.0 lb

## 2017-04-04 DIAGNOSIS — O34219 Maternal care for unspecified type scar from previous cesarean delivery: Secondary | ICD-10-CM

## 2017-04-04 DIAGNOSIS — Z3A38 38 weeks gestation of pregnancy: Secondary | ICD-10-CM

## 2017-04-04 DIAGNOSIS — Z98891 History of uterine scar from previous surgery: Secondary | ICD-10-CM

## 2017-04-04 DIAGNOSIS — Z3480 Encounter for supervision of other normal pregnancy, unspecified trimester: Secondary | ICD-10-CM

## 2017-04-04 NOTE — Progress Notes (Signed)
Routine Prenatal Care Visit  Subjective  Margaret Ortega is a 32 y.o. [id number] at 7761w4d being seen today for ongoing prenatal care.  She is currently monitored for the following issues for this high-risk pregnancy and has Supervision of other normal pregnancy, antepartum; History of cesarean section; and History of successful vaginal birth after cesarean, currently pregnant on their problem list.  ----------------------------------------------------------------------------------- Patient reports no complaints.   Contractions: Not present. Vag. Bleeding: None.  Movement: Present. Denies leaking of fluid.  ----------------------------------------------------------------------------------- The following portions of the patient's history were reviewed and updated as appropriate: allergies, current medications, past family history, past medical history, past social history, past surgical history and problem list. Problem list updated.   Objective  Blood pressure (!) 100/50, weight 254 lb (115.2 kg), last menstrual period 07/08/2016. Pregravid weight 222 lb (100.7 kg) Total Weight Gain 32 lb (14.5 kg) Urinalysis: Urine Protein: Negative Urine Glucose: Negative  Fetal Status: Fetal Heart Rate (bpm): 145 Fundal Height: 40 cm Movement: Present     General:  Alert, oriented and cooperative. Patient is in no acute distress.  Skin: Skin is warm and dry. No rash noted.   Cardiovascular: Normal heart rate noted  Respiratory: Normal respiratory effort, no problems with respiration noted  Abdomen: Soft, gravid, appropriate for gestational age. Pain/Pressure: Present     Pelvic:  Cervical exam performed Dilation: 2 Effacement (%): 60 Station: -2  Extremities: Normal range of motion.     Mental Status: Normal mood and affect. Normal behavior. Normal judgment and thought content.   Assessment   32 y.o. [name] at 6161w4d by  04/14/2017, by Last Menstrual Period presenting for routine prenatal visit  Plan    pregnancy 4 Problems (from 07/08/16 to present)    Problem Noted Resolved   Supervision of other normal pregnancy, antepartum 09/10/2016 by Tresea MallGledhill, Jane, CNM No   Overview Signed 01/23/2017  1:35 PM by Farrel ConnersGutierrez, Colleen, CNM     [facility]inic [facility] Prenatal Labs  Dating LMP Blood type: B/Positive/-- (07/12 1118)   Genetic Screen 1 Screen: neg   AFP:     Quad:     NIPS: Antibody:Negative (07/12 1118)  Anatomic US WNL, posterior placenta, baby boy Rubella: 1.43 (07/12 1118) Varicella NI  GTT Early:    130           Third trimester:  RPR: Non Reactive (07/12 1118)   Flu vaccine  HBsAg: Negative (07/12 1118)   TDaP vaccine                                               Rhogam: HIV:   negative  Baby Food      Breast                                         GBS: (For PCN allergy, check sensitivities)  Contraception  Pap: ?2015  Circumcision    Pediatrician    Support Person         History of cesarean section 09/10/2016 by Tresea MallGledhill, Jane, CNM No   History of successful vaginal birth after cesarean, currently pregnant 09/10/2016 by Tresea MallGledhill, Jane, CNM No      Term labor symptoms and general obstetric precautions including but not limited to vaginal bleeding, contractions, leaking of fluid  and fetal movement were reviewed in detail with the patient. Please refer to After Visit Summary for other counseling recommendations.   Return in about 1 week (around 04/11/2017) for Routine Prenatal Appointment.  - pt still desires [facility].  States she lives far away and may need to deliver at [facility] or [facility].    Thomasene Mohair, MD  04/04/2017 12:25 PM

## 2017-04-11 ENCOUNTER — Encounter: Payer: Medicaid Other | Admitting: Maternal Newborn

## 2017-07-07 ENCOUNTER — Encounter (HOSPITAL_COMMUNITY): Payer: Self-pay
